# Patient Record
Sex: Female | Born: 1970 | Race: White | Hispanic: No | Marital: Single | Smoking: Current every day smoker
Health system: Southern US, Community
[De-identification: ages and names within clinical notes are randomized; demographics above are authoritative.]

## PROBLEM LIST (undated history)

## (undated) DIAGNOSIS — I10 Essential (primary) hypertension: Secondary | ICD-10-CM

## (undated) DIAGNOSIS — M5430 Sciatica, unspecified side: Secondary | ICD-10-CM

## (undated) DIAGNOSIS — K219 Gastro-esophageal reflux disease without esophagitis: Secondary | ICD-10-CM

## (undated) DIAGNOSIS — F329 Major depressive disorder, single episode, unspecified: Secondary | ICD-10-CM

## (undated) DIAGNOSIS — F419 Anxiety disorder, unspecified: Secondary | ICD-10-CM

## (undated) DIAGNOSIS — F319 Bipolar disorder, unspecified: Secondary | ICD-10-CM

## (undated) DIAGNOSIS — E785 Hyperlipidemia, unspecified: Secondary | ICD-10-CM

## (undated) DIAGNOSIS — E119 Type 2 diabetes mellitus without complications: Secondary | ICD-10-CM

## (undated) DIAGNOSIS — G8929 Other chronic pain: Secondary | ICD-10-CM

## (undated) DIAGNOSIS — E559 Vitamin D deficiency, unspecified: Secondary | ICD-10-CM

## (undated) DIAGNOSIS — F32A Depression, unspecified: Secondary | ICD-10-CM

## (undated) DIAGNOSIS — F4001 Agoraphobia with panic disorder: Secondary | ICD-10-CM

## (undated) HISTORY — PX: ABDOMINAL HYSTERECTOMY: SHX81

## (undated) HISTORY — PX: NECK SURGERY: SHX720

## (undated) HISTORY — PX: BUNIONECTOMY: SHX129

## (undated) HISTORY — PX: BACK SURGERY: SHX140

---

## 1998-11-22 ENCOUNTER — Other Ambulatory Visit: Admission: RE | Admit: 1998-11-22 | Discharge: 1998-11-22 | Payer: Self-pay | Admitting: Podiatrist

## 1999-04-18 ENCOUNTER — Encounter: Payer: Self-pay | Admitting: Emergency Medicine

## 1999-04-18 ENCOUNTER — Emergency Department (HOSPITAL_COMMUNITY): Admission: EM | Admit: 1999-04-18 | Discharge: 1999-04-18 | Payer: Self-pay | Admitting: Emergency Medicine

## 2000-06-20 ENCOUNTER — Emergency Department (HOSPITAL_COMMUNITY): Admission: EM | Admit: 2000-06-20 | Discharge: 2000-06-20 | Payer: Self-pay | Admitting: Emergency Medicine

## 2000-06-20 ENCOUNTER — Encounter: Payer: Self-pay | Admitting: Emergency Medicine

## 2001-06-03 ENCOUNTER — Encounter: Payer: Self-pay | Admitting: Emergency Medicine

## 2001-06-03 ENCOUNTER — Emergency Department (HOSPITAL_COMMUNITY): Admission: EM | Admit: 2001-06-03 | Discharge: 2001-06-03 | Payer: Self-pay | Admitting: Emergency Medicine

## 2001-10-26 ENCOUNTER — Encounter: Payer: Self-pay | Admitting: Emergency Medicine

## 2001-10-26 ENCOUNTER — Emergency Department (HOSPITAL_COMMUNITY): Admission: EM | Admit: 2001-10-26 | Discharge: 2001-10-26 | Payer: Self-pay | Admitting: Emergency Medicine

## 2001-12-02 ENCOUNTER — Emergency Department (HOSPITAL_COMMUNITY): Admission: EM | Admit: 2001-12-02 | Discharge: 2001-12-03 | Payer: Self-pay

## 2003-09-25 ENCOUNTER — Emergency Department (HOSPITAL_COMMUNITY): Admission: EM | Admit: 2003-09-25 | Discharge: 2003-09-25 | Payer: Self-pay | Admitting: Emergency Medicine

## 2004-02-03 ENCOUNTER — Emergency Department (HOSPITAL_COMMUNITY): Admission: EM | Admit: 2004-02-03 | Discharge: 2004-02-03 | Payer: Self-pay | Admitting: Emergency Medicine

## 2004-05-09 ENCOUNTER — Emergency Department (HOSPITAL_COMMUNITY): Admission: EM | Admit: 2004-05-09 | Discharge: 2004-05-09 | Payer: Self-pay | Admitting: Emergency Medicine

## 2004-08-21 ENCOUNTER — Emergency Department (HOSPITAL_COMMUNITY): Admission: EM | Admit: 2004-08-21 | Discharge: 2004-08-21 | Payer: Self-pay | Admitting: Emergency Medicine

## 2004-11-13 ENCOUNTER — Emergency Department (HOSPITAL_COMMUNITY): Admission: EM | Admit: 2004-11-13 | Discharge: 2004-11-13 | Payer: Self-pay | Admitting: Emergency Medicine

## 2005-02-22 IMAGING — CT CT ANGIO CHEST
1 of 4 series · 19 of 36 positions shown · IV contrast (CONTRAST)
Comparison: none

CLINICAL DATA: Chest pain, dyspnea. 
 CT ANGIO CHEST, 02/03/04
 Multidetector CT imaging of the chest was performed according to the protocol for detection of pulmonary embolism during IV bolus injection of 150 ml Omnipaque 300.  Coronal and sagittal plane reformatted images were also generated.

[Series 8133: — · axial · 0.71mm/px · z∈[+1471,+1674]mm · 19 of 373 slices shown]
[im 17/373  lung]
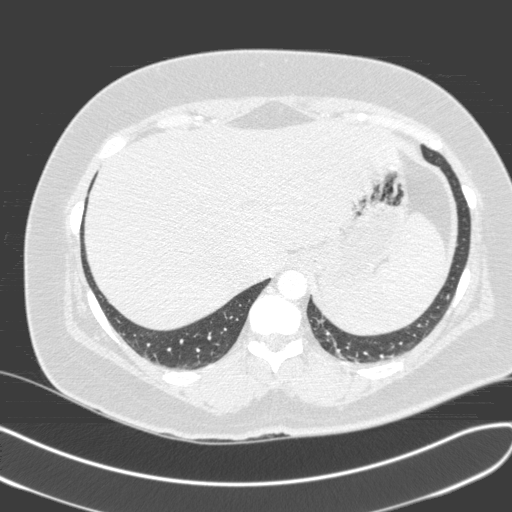
[im 34/373  mediastinal]
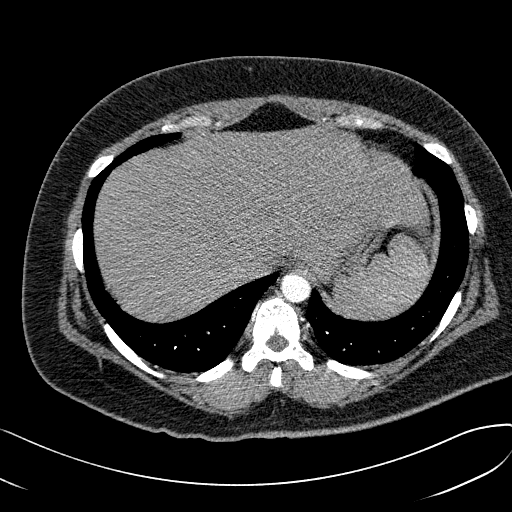
[im 51/373  lung]
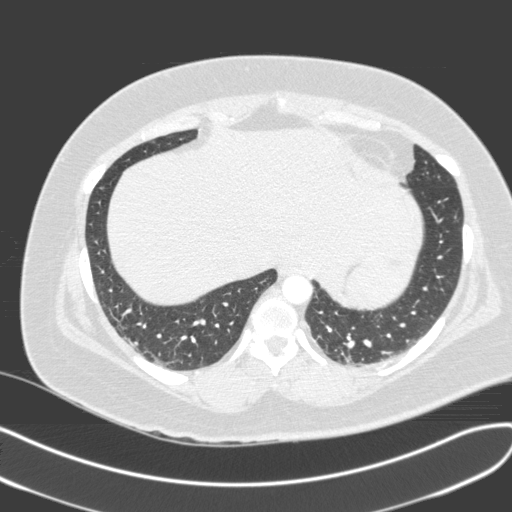
[im 68/373  mediastinal]
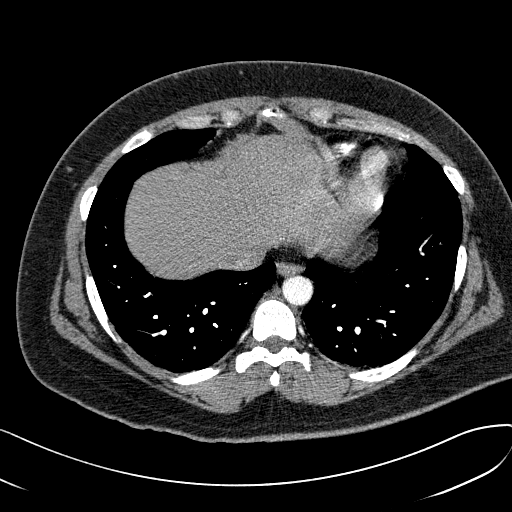
[im 85/373  lung]
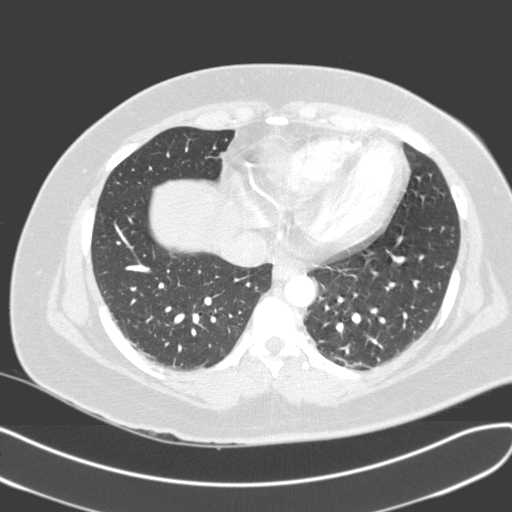
[im 119/373  mediastinal]
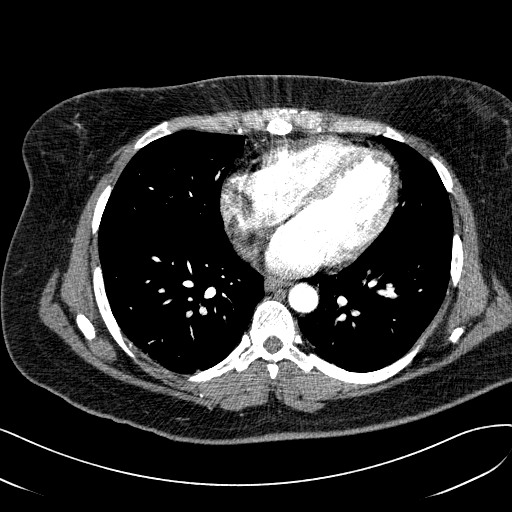
[im 136/373  lung]
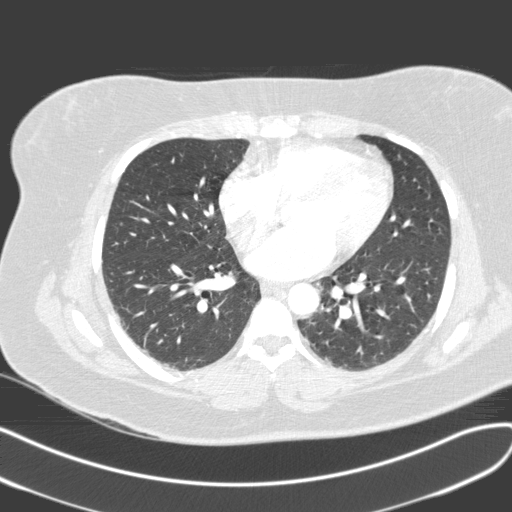
[im 153/373  mediastinal]
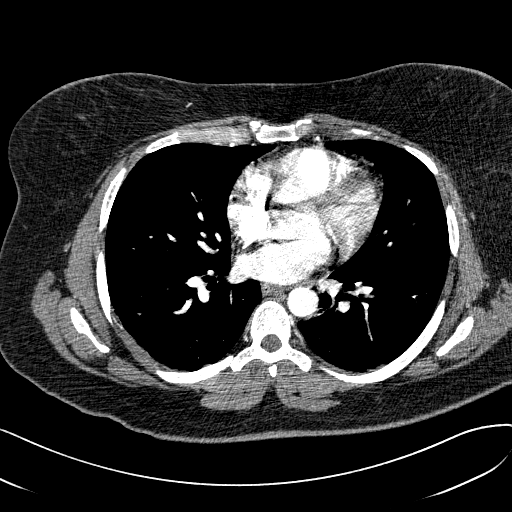
[im 170/373  lung]
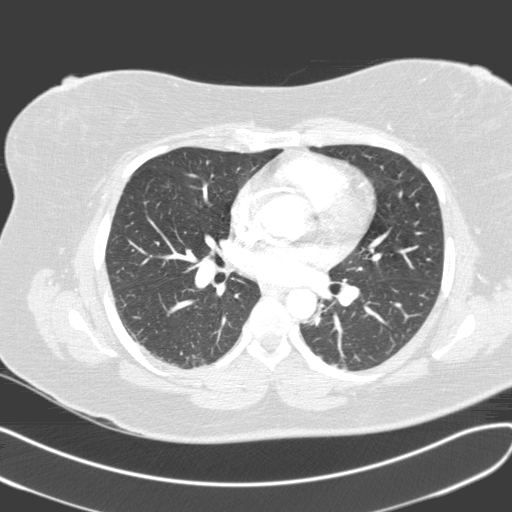
[im 187/373  mediastinal]
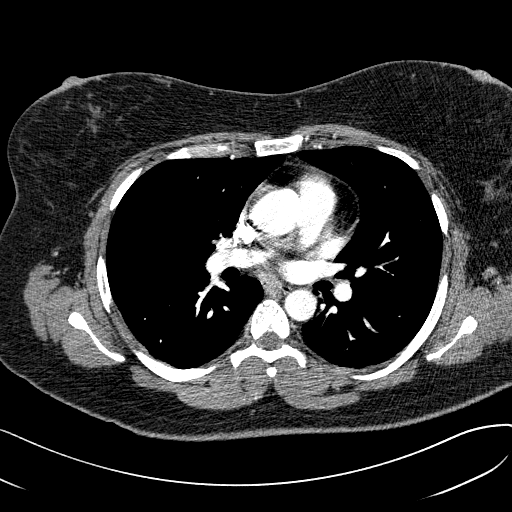
[im 203/373  lung]
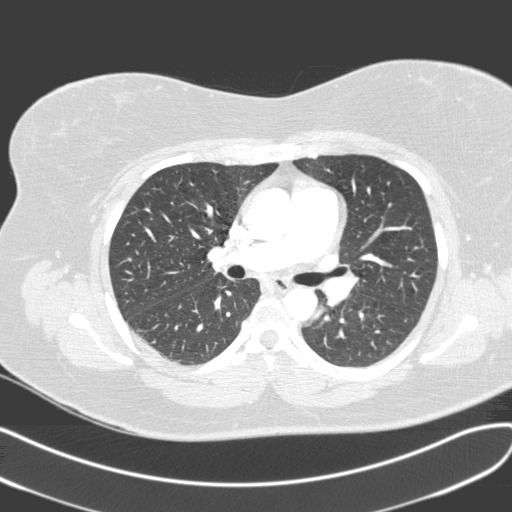
[im 220/373  mediastinal]
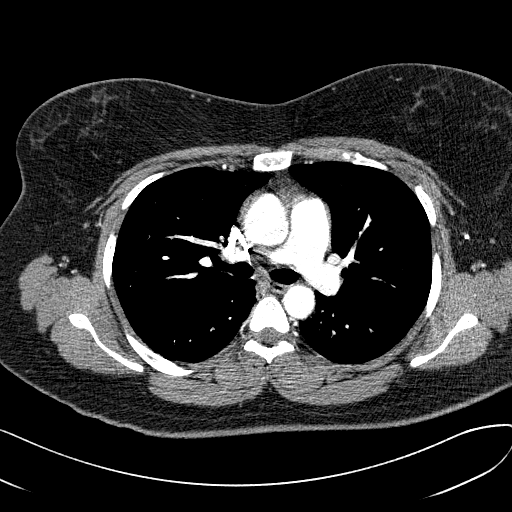
[im 237/373  lung]
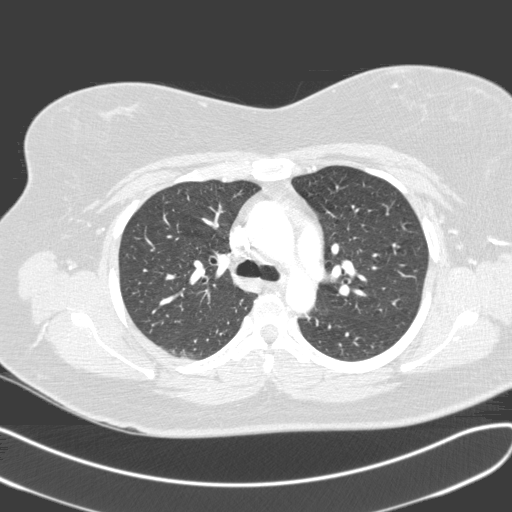
[im 254/373  mediastinal]
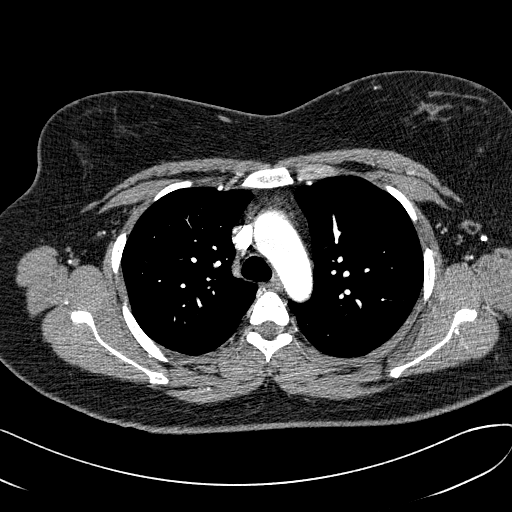
[im 288/373  lung]
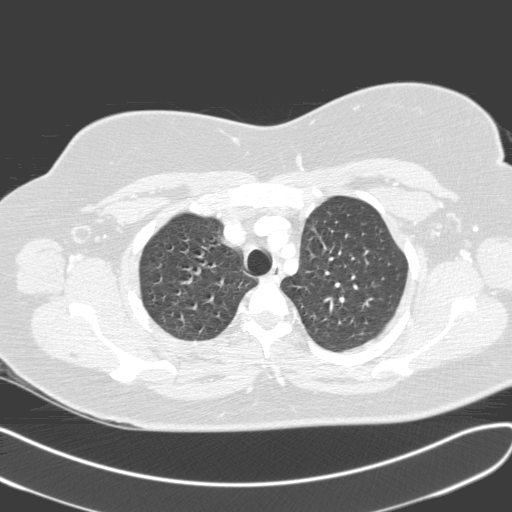
[im 305/373  mediastinal]
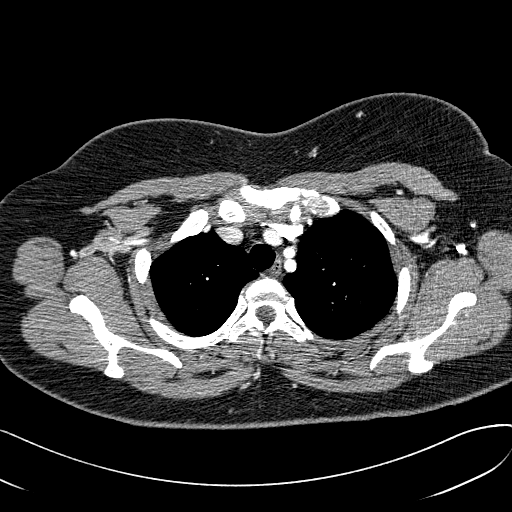
[im 322/373  lung]
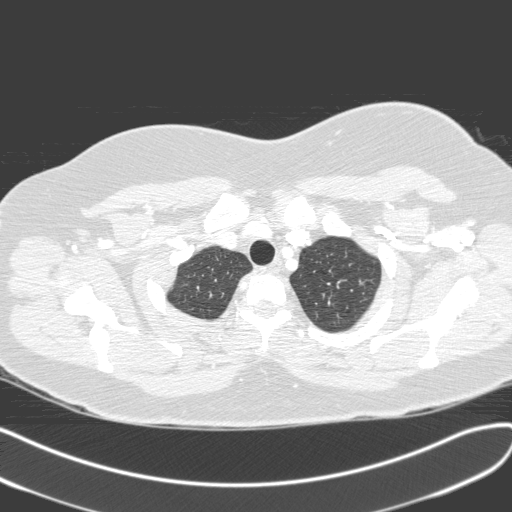
[im 339/373  mediastinal]
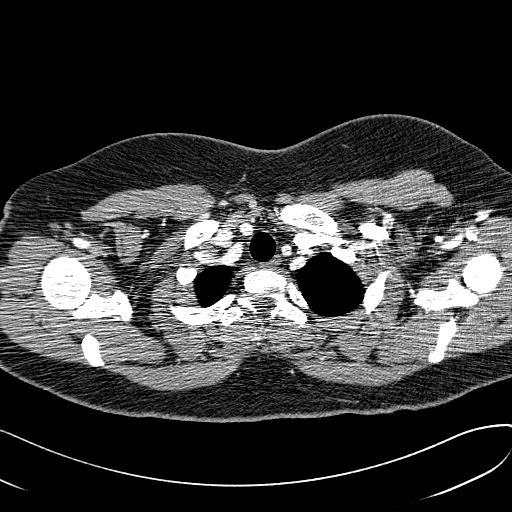
[im 356/373  lung]
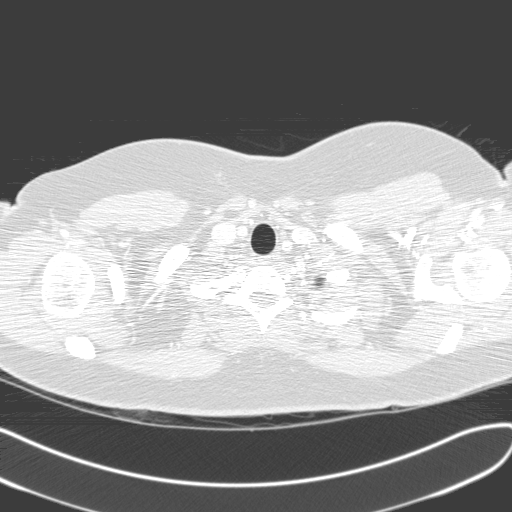

[19 of 36 positions shown; findings below may reference images not displayed]

FINDINGS: Heart and great vessels are unremarkable.  No filling defects in the pulmonary arterial system are noted to suggest pulmonary embolus.  No evidence of aortic aneurysm or dissection.  No pleural or pericardial effusions are present or evidence of enlarged lymph nodes in the axillary, mediastinal or hilar regions.   Lungs are clear except for minimal dependent atelectasis.  Visualized upper abdomen is unremarkable.  
 IMPRESSION
 No evidence of acute abnormality.  
 No evidence of pulmonary embolus.

## 2005-02-22 IMAGING — CR DG CHEST 1V PORT
1 series · 1 of 1 positions shown · non-contrast
Comparison: 09/25/03.

CLINICAL DATA: chest pain   
 PORTABLE CHEST RADIOGRAPH 02/03/04

[view not recorded]
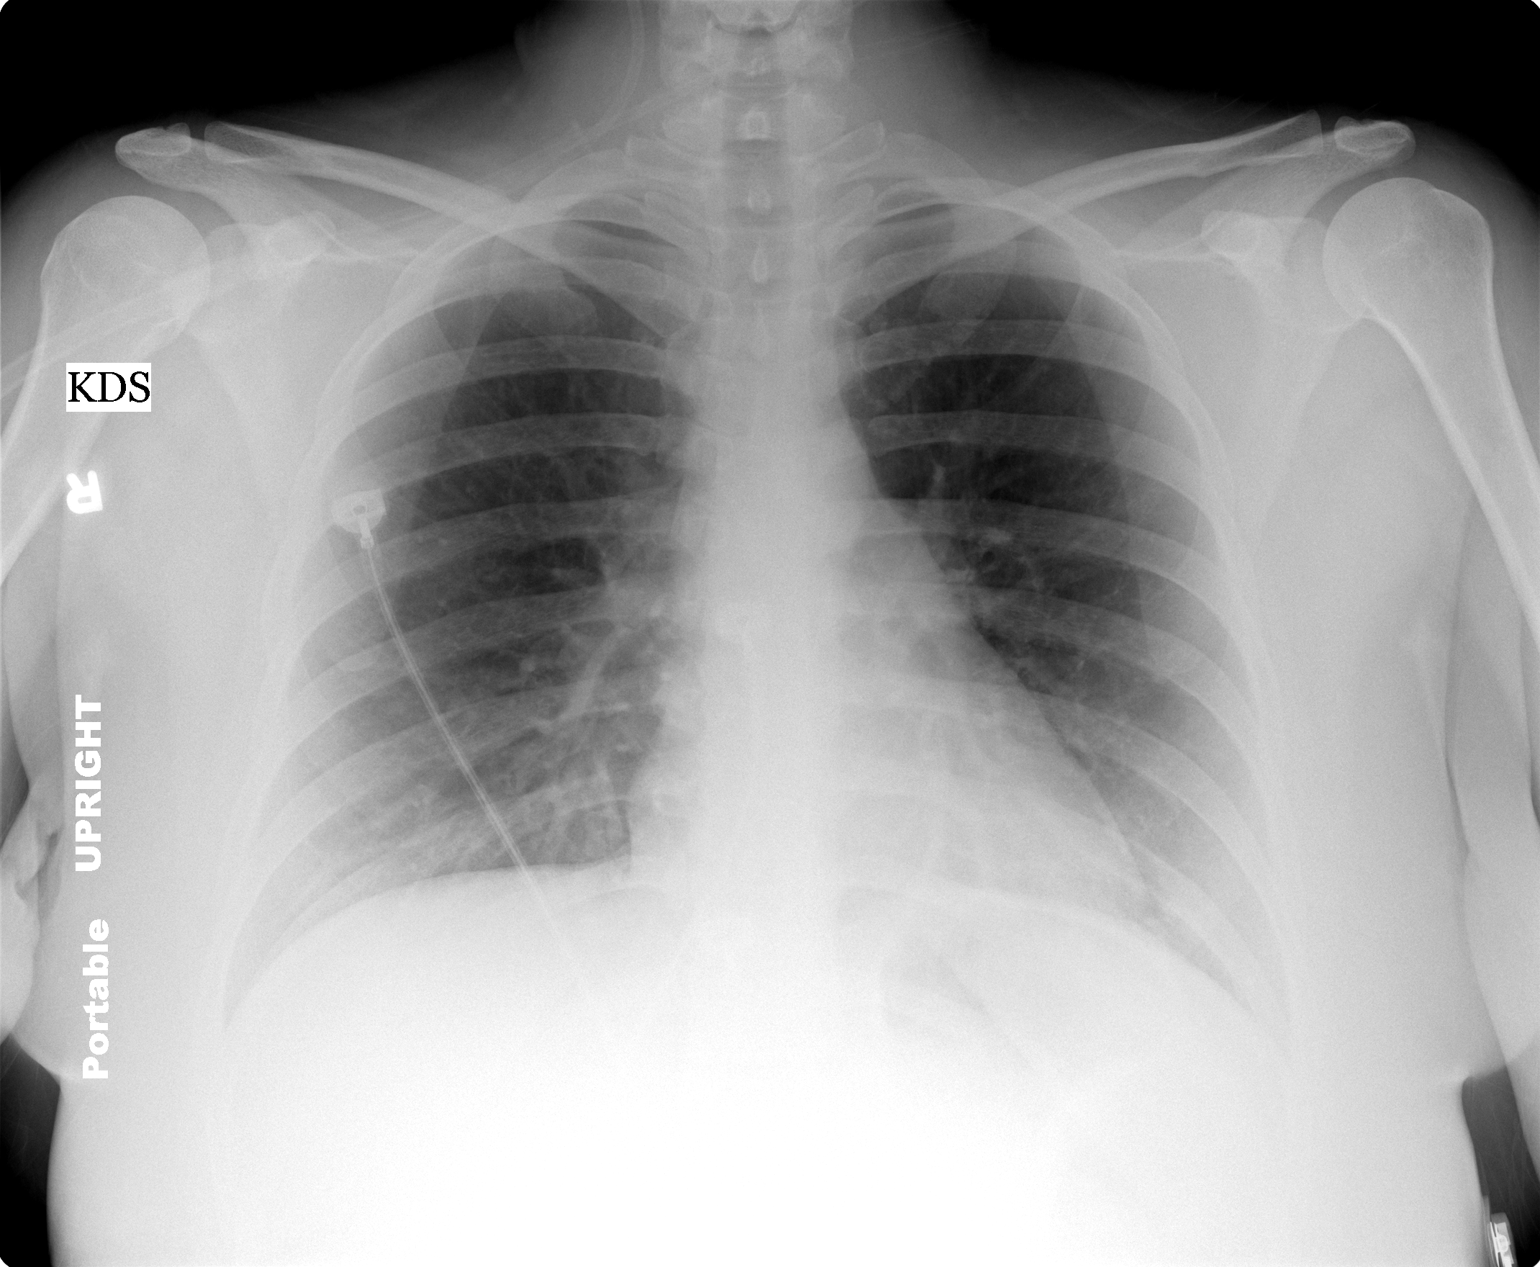

[1 of 1 positions shown; findings below may reference images not displayed]

The heart size and mediastinal contours are within normal limits. The lungs are clear.

 IMPRESSION

 No acute disease.

## 2005-04-15 ENCOUNTER — Emergency Department (HOSPITAL_COMMUNITY): Admission: EM | Admit: 2005-04-15 | Discharge: 2005-04-15 | Payer: Self-pay | Admitting: Emergency Medicine

## 2005-10-09 ENCOUNTER — Emergency Department (HOSPITAL_COMMUNITY): Admission: EM | Admit: 2005-10-09 | Discharge: 2005-10-09 | Payer: Self-pay | Admitting: Emergency Medicine

## 2006-06-13 ENCOUNTER — Ambulatory Visit (HOSPITAL_COMMUNITY): Admission: RE | Admit: 2006-06-13 | Discharge: 2006-06-13 | Payer: Self-pay | Admitting: Family Medicine

## 2007-01-16 ENCOUNTER — Emergency Department (HOSPITAL_COMMUNITY): Admission: EM | Admit: 2007-01-16 | Discharge: 2007-01-16 | Payer: Self-pay | Admitting: Specialist

## 2007-04-13 ENCOUNTER — Emergency Department (HOSPITAL_COMMUNITY): Admission: EM | Admit: 2007-04-13 | Discharge: 2007-04-13 | Payer: Self-pay | Admitting: Emergency Medicine

## 2007-05-04 ENCOUNTER — Emergency Department (HOSPITAL_COMMUNITY): Admission: EM | Admit: 2007-05-04 | Discharge: 2007-05-04 | Payer: Self-pay | Admitting: Emergency Medicine

## 2007-09-18 ENCOUNTER — Emergency Department (HOSPITAL_COMMUNITY): Admission: EM | Admit: 2007-09-18 | Discharge: 2007-09-18 | Payer: Self-pay | Admitting: Emergency Medicine

## 2007-11-02 ENCOUNTER — Emergency Department (HOSPITAL_COMMUNITY): Admission: EM | Admit: 2007-11-02 | Discharge: 2007-11-02 | Payer: Self-pay | Admitting: Emergency Medicine

## 2007-12-27 ENCOUNTER — Emergency Department (HOSPITAL_COMMUNITY): Admission: EM | Admit: 2007-12-27 | Discharge: 2007-12-27 | Payer: Self-pay | Admitting: Emergency Medicine

## 2010-12-23 NOTE — Procedures (Signed)
Kristin Humphrey, Kristin Humphrey                 ACCOUNT NO.:  [id number]   MEDICAL RECORD NO.:  [id number]          PATIENT TYPE:  OUT   LOCATION:  RESP                          FACILITY:  [facility]   PHYSICIAN:  Kofi A. Gerilyn Pilgrim, M.D. DATE OF BIRTH:  09-30-1970   DATE OF PROCEDURE:  DATE OF DISCHARGE:                              EEG INTERPRETATION   Patient is a 40 year old lady who presents with fainting spells.   MEDICATIONS:  Lamictal, Cymbalta, Levaquin.   ANALYSIS:  A 16-channel recording is conducted using a standard 10-20  protocol.  There is a posterior rhythm of 8 Hz that is well formed.  There is beta activity noted in the frontal areas.  Awake and drowsy  activities are recorded.  Photic stimulation does not indicate  significant changes in the background activity.  There is no focal  slowing, lateralized slowing, or epileptiform activity noted.   IMPRESSION:  This is a normal recording of the awake and drowsy states.  The single recording does not rule out the epileptic seizures.  If  clinically indicated, a sleep-deprived recording may be useful.      Kofi A. Gerilyn Pilgrim, M.D.  Electronically Signed     KAD/MEDQ  D:  06/13/2006  T:  06/13/2006  Job:  [id number]

## 2011-04-28 LAB — URINE MICROSCOPIC-ADD ON

## 2011-04-28 LAB — URINALYSIS, ROUTINE W REFLEX MICROSCOPIC
Bilirubin Urine: NEGATIVE
Glucose, UA: NEGATIVE
Ketones, ur: NEGATIVE
Nitrite: NEGATIVE
Protein, ur: NEGATIVE
Specific Gravity, Urine: <1.005 — ABNORMAL LOW
Urobilinogen, UA: 0.2
pH: 6.5

## 2011-05-01 LAB — URINALYSIS, ROUTINE W REFLEX MICROSCOPIC
Bilirubin Urine: NEGATIVE
Glucose, UA: 100 — AB
Ketones, ur: NEGATIVE
Nitrite: NEGATIVE
Protein, ur: NEGATIVE
Specific Gravity, Urine: 1.025
Urobilinogen, UA: 0.2
pH: 5.5

## 2011-05-01 LAB — PREGNANCY, URINE: Preg Test, Ur: NEGATIVE

## 2011-05-01 LAB — URINE MICROSCOPIC-ADD ON

## 2011-05-03 LAB — URINE MICROSCOPIC-ADD ON

## 2011-05-03 LAB — DIFFERENTIAL
Basophils Absolute: 0.1
Eosinophils Relative: 2
Lymphocytes Relative: 29
Neutro Abs: 10 — ABNORMAL HIGH
Neutrophils Relative %: 65

## 2011-05-03 LAB — URINALYSIS, ROUTINE W REFLEX MICROSCOPIC
Glucose, UA: NEGATIVE
Ketones, ur: NEGATIVE
Leukocytes, UA: NEGATIVE
Nitrite: NEGATIVE
Protein, ur: 30 — AB
Specific Gravity, Urine: >1.03 — ABNORMAL HIGH
Urobilinogen, UA: 0.2
pH: 5.5

## 2011-05-03 LAB — BASIC METABOLIC PANEL
BUN: 8
CO2: 26
Calcium: 9.4
Chloride: 107
Creatinine, Ser: 0.78
GFR calc Af Amer: >60
GFR calc non Af Amer: >60
Glucose, Bld: 194 — ABNORMAL HIGH
Potassium: 3.8
Sodium: 140

## 2011-05-03 LAB — CBC
HCT: 38.5
Hemoglobin: 13.4
MCHC: 34.7
MCV: 86
Platelets: 314
RBC: 4.47
RDW: 14.7
WBC: 15.6 — ABNORMAL HIGH

## 2011-05-19 LAB — URINE CULTURE: Colony Count: 35000

## 2011-05-19 LAB — URINALYSIS, ROUTINE W REFLEX MICROSCOPIC
Ketones, ur: NEGATIVE
Leukocytes, UA: NEGATIVE
Nitrite: NEGATIVE
Specific Gravity, Urine: 1.02
pH: 6.5

## 2011-05-19 LAB — URINE MICROSCOPIC-ADD ON

## 2014-01-08 ENCOUNTER — Telehealth: Payer: Self-pay | Admitting: Internal Medicine

## 2014-01-08 NOTE — Telephone Encounter (Signed)
This pt has never been seen in out office I called Iris from [facility] and she reports she did not call us regarding this pt and did not know who I was talking about Will take message to [facility] and sign off

## 2015-04-03 ENCOUNTER — Emergency Department (HOSPITAL_COMMUNITY)
Admission: EM | Admit: 2015-04-03 | Discharge: 2015-04-03 | Disposition: A | Discharge status: Other Acute Inpatient Hospital | Payer: PRIVATE HEALTH INSURANCE | Attending: Emergency Medicine | Admitting: Emergency Medicine

## 2015-04-03 ENCOUNTER — Encounter (HOSPITAL_COMMUNITY): Payer: Self-pay | Admitting: Emergency Medicine

## 2015-04-03 ENCOUNTER — Inpatient Hospital Stay (HOSPITAL_COMMUNITY)
Admission: AD | Admit: 2015-04-03 | Discharge: 2015-04-07 | DRG: 885 | Disposition: A | Discharge status: Home / Self Care | Payer: PRIVATE HEALTH INSURANCE | Source: Intra-hospital | Attending: Psychiatry | Admitting: Psychiatry

## 2015-04-03 DIAGNOSIS — Z008 Encounter for other general examination: Secondary | ICD-10-CM | POA: Diagnosis present

## 2015-04-03 DIAGNOSIS — F332 Major depressive disorder, recurrent severe without psychotic features: Principal | ICD-10-CM | POA: Diagnosis present

## 2015-04-03 DIAGNOSIS — Z833 Family history of diabetes mellitus: Secondary | ICD-10-CM | POA: Diagnosis not present

## 2015-04-03 DIAGNOSIS — G4700 Insomnia, unspecified: Secondary | ICD-10-CM | POA: Diagnosis present

## 2015-04-03 DIAGNOSIS — Z8249 Family history of ischemic heart disease and other diseases of the circulatory system: Secondary | ICD-10-CM

## 2015-04-03 DIAGNOSIS — Z3202 Encounter for pregnancy test, result negative: Secondary | ICD-10-CM | POA: Insufficient documentation

## 2015-04-03 DIAGNOSIS — F419 Anxiety disorder, unspecified: Secondary | ICD-10-CM | POA: Diagnosis not present

## 2015-04-03 DIAGNOSIS — F17210 Nicotine dependence, cigarettes, uncomplicated: Secondary | ICD-10-CM | POA: Diagnosis present

## 2015-04-03 DIAGNOSIS — F410 Panic disorder [episodic paroxysmal anxiety] without agoraphobia: Secondary | ICD-10-CM | POA: Diagnosis present

## 2015-04-03 DIAGNOSIS — E559 Vitamin D deficiency, unspecified: Secondary | ICD-10-CM | POA: Diagnosis present

## 2015-04-03 DIAGNOSIS — Z720 Tobacco use: Secondary | ICD-10-CM | POA: Diagnosis not present

## 2015-04-03 DIAGNOSIS — F329 Major depressive disorder, single episode, unspecified: Secondary | ICD-10-CM | POA: Diagnosis present

## 2015-04-03 DIAGNOSIS — E119 Type 2 diabetes mellitus without complications: Secondary | ICD-10-CM | POA: Insufficient documentation

## 2015-04-03 DIAGNOSIS — R000 Tachycardia, unspecified: Secondary | ICD-10-CM

## 2015-04-03 DIAGNOSIS — M549 Dorsalgia, unspecified: Secondary | ICD-10-CM | POA: Diagnosis not present

## 2015-04-03 DIAGNOSIS — F411 Generalized anxiety disorder: Secondary | ICD-10-CM | POA: Diagnosis present

## 2015-04-03 DIAGNOSIS — R45851 Suicidal ideations: Secondary | ICD-10-CM

## 2015-04-03 DIAGNOSIS — F322 Major depressive disorder, single episode, severe without psychotic features: Secondary | ICD-10-CM | POA: Insufficient documentation

## 2015-04-03 HISTORY — DX: Anxiety disorder, unspecified: F41.9

## 2015-04-03 HISTORY — DX: Depression, unspecified: F32.A

## 2015-04-03 HISTORY — DX: Type 2 diabetes mellitus without complications: E11.9

## 2015-04-03 HISTORY — DX: Major depressive disorder, single episode, unspecified: F32.9

## 2015-04-03 LAB — COMPREHENSIVE METABOLIC PANEL
ALK PHOS: 74 U/L (ref 38–126)
ALT: 18 U/L (ref 14–54)
AST: 23 U/L (ref 15–41)
Albumin: 4.1 g/dL (ref 3.5–5.0)
Anion gap: 14 (ref 5–15)
BUN: 13 mg/dL (ref 6–20)
CALCIUM: 9.3 mg/dL (ref 8.9–10.3)
CO2: 21 mmol/L — ABNORMAL LOW (ref 22–32)
CREATININE: 0.69 mg/dL (ref 0.44–1.00)
Chloride: 102 mmol/L (ref 101–111)
Glucose, Bld: 275 mg/dL — ABNORMAL HIGH (ref 65–99)
Potassium: 3.6 mmol/L (ref 3.5–5.1)
Sodium: 137 mmol/L (ref 135–145)
Total Bilirubin: 0.5 mg/dL (ref 0.3–1.2)
Total Protein: 7.6 g/dL (ref 6.5–8.1)

## 2015-04-03 LAB — CBC WITH DIFFERENTIAL/PLATELET
BASOS ABS: 0.1 10*3/uL (ref 0.0–0.1)
BASOS PCT: 1 % (ref 0–1)
EOS ABS: 0.3 10*3/uL (ref 0.0–0.7)
Eosinophils Relative: 2 % (ref 0–5)
HCT: 36.6 % (ref 36.0–46.0)
HEMOGLOBIN: 11.2 g/dL — AB (ref 12.0–15.0)
Lymphocytes Relative: 31 % (ref 12–46)
Lymphs Abs: 4.4 10*3/uL — ABNORMAL HIGH (ref 0.7–4.0)
MCH: 22.2 pg — ABNORMAL LOW (ref 26.0–34.0)
MCHC: 30.6 g/dL (ref 30.0–36.0)
MCV: 72.6 fL — ABNORMAL LOW (ref 78.0–100.0)
Monocytes Absolute: 0.7 10*3/uL (ref 0.1–1.0)
Monocytes Relative: 5 % (ref 3–12)
NEUTROS PCT: 61 % (ref 43–77)
Neutro Abs: 8.6 10*3/uL — ABNORMAL HIGH (ref 1.7–7.7)
Platelets: 308 10*3/uL (ref 150–400)
RBC: 5.04 MIL/uL (ref 3.87–5.11)
RDW: 16.9 % — ABNORMAL HIGH (ref 11.5–15.5)
WBC: 14.2 10*3/uL — AB (ref 4.0–10.5)

## 2015-04-03 LAB — RAPID URINE DRUG SCREEN, HOSP PERFORMED
AMPHETAMINES: NOT DETECTED
BENZODIAZEPINES: NOT DETECTED
Barbiturates: NOT DETECTED
Cocaine: NOT DETECTED
OPIATES: NOT DETECTED
Tetrahydrocannabinol: NOT DETECTED

## 2015-04-03 LAB — ETHANOL: Alcohol, Ethyl (B): <5 mg/dL (ref <5)

## 2015-04-03 LAB — POC URINE PREG, ED: PREG TEST UR: NEGATIVE

## 2015-04-03 LAB — CBG MONITORING, ED
GLUCOSE-CAPILLARY: 254 mg/dL — AB (ref 65–99)
GLUCOSE-CAPILLARY: 224 mg/dL — AB (ref 65–99)
GLUCOSE-CAPILLARY: 171 mg/dL — AB (ref 65–99)

## 2015-04-03 MED ORDER — LORAZEPAM 1 MG PO TABS
1.0000 mg | ORAL_TABLET | Freq: Once | ORAL | Status: AC
  Administered 2015-04-03: 1 mg via ORAL
  Filled 2015-04-03: qty 1

## 2015-04-03 MED ORDER — NICOTINE 14 MG/24HR TD PT24
14.0000 mg | MEDICATED_PATCH | Freq: Every day | TRANSDERMAL | Status: DC
  Administered 2015-04-03: 14 mg via TRANSDERMAL
  Filled 2015-04-03: qty 1

## 2015-04-03 MED ORDER — GABAPENTIN 400 MG PO CAPS
ORAL_CAPSULE | ORAL | Status: AC
Start: 2015-04-03 — End: 2015-04-03
  Filled 2015-04-03: qty 2

## 2015-04-03 MED ORDER — ALUM & MAG HYDROXIDE-SIMETH 200-200-20 MG/5ML PO SUSP
30.0000 mL | ORAL | Status: DC | PRN
Start: 2015-04-03 — End: 2015-04-03

## 2015-04-03 MED ORDER — BACLOFEN 10 MG PO TABS
10.0000 mg | ORAL_TABLET | Freq: Two times a day (BID) | ORAL | Status: DC
  Administered 2015-04-03: 10 mg via ORAL
  Filled 2015-04-03: qty 1

## 2015-04-03 MED ORDER — GABAPENTIN 300 MG PO CAPS
ORAL_CAPSULE | ORAL | Status: AC
  Filled 2015-04-03: qty 2

## 2015-04-03 MED ORDER — GABAPENTIN 300 MG PO CAPS
ORAL_CAPSULE | ORAL | Status: AC
  Filled 2015-04-03: qty 1

## 2015-04-03 MED ORDER — LISINOPRIL 10 MG PO TABS: 20.0000 mg | ORAL_TABLET | Freq: Every day | ORAL | Status: DC

## 2015-04-03 MED ORDER — ACETAMINOPHEN 500 MG PO TABS: 1000.0000 mg | ORAL_TABLET | Freq: Four times a day (QID) | ORAL | Status: DC | PRN

## 2015-04-03 MED ORDER — CLONAZEPAM 0.5 MG PO TABS
0.5000 mg | ORAL_TABLET | Freq: Three times a day (TID) | ORAL | Status: DC
  Administered 2015-04-03: 0.5 mg via ORAL
  Filled 2015-04-03: qty 1

## 2015-04-03 MED ORDER — LEVOTHYROXINE SODIUM 50 MCG PO TABS: 100.0000 ug | ORAL_TABLET | Freq: Every day | ORAL | Status: DC

## 2015-04-03 MED ORDER — IBUPROFEN 800 MG PO TABS
800.0000 mg | ORAL_TABLET | Freq: Four times a day (QID) | ORAL | Status: DC | PRN
  Administered 2015-04-03: 800 mg via ORAL
  Filled 2015-04-03: qty 1

## 2015-04-03 MED ORDER — INSULIN ASPART PROT & ASPART (70-30 MIX) 100 UNIT/ML ~~LOC~~ SUSP
SUBCUTANEOUS | Status: AC
  Filled 2015-04-03: qty 10

## 2015-04-03 MED ORDER — GABAPENTIN 100 MG PO CAPS
ORAL_CAPSULE | ORAL | Status: AC
  Filled 2015-04-03: qty 2

## 2015-04-03 MED ORDER — INSULIN GLARGINE 100 UNIT/ML ~~LOC~~ SOLN
10.0000 [IU] | Freq: Every day | SUBCUTANEOUS | Status: DC
  Administered 2015-04-03: 10 [IU] via SUBCUTANEOUS
  Filled 2015-04-03 (×2): qty 0.1

## 2015-04-03 MED ORDER — MAGNESIUM HYDROXIDE 400 MG/5ML PO SUSP: 30.0000 mL | Freq: Every day | ORAL | Status: DC | PRN

## 2015-04-03 MED ORDER — INSULIN ASPART PROT & ASPART (70-30 MIX) 100 UNIT/ML ~~LOC~~ SUSP
24.0000 [IU] | Freq: Every day | SUBCUTANEOUS | Status: DC
  Administered 2015-04-03: 24 [IU] via SUBCUTANEOUS
  Filled 2015-04-03: qty 10

## 2015-04-03 MED ORDER — LISINOPRIL-HYDROCHLOROTHIAZIDE 20-25 MG PO TABS: 1.0000 | ORAL_TABLET | Freq: Every day | ORAL | Status: DC

## 2015-04-03 MED ORDER — HYDROCHLOROTHIAZIDE 25 MG PO TABS: 25.0000 mg | ORAL_TABLET | Freq: Every day | ORAL | Status: DC

## 2015-04-03 MED ORDER — GABAPENTIN 800 MG PO TABS
800.0000 mg | ORAL_TABLET | Freq: Four times a day (QID) | ORAL | Status: DC
  Administered 2015-04-03 (×2): 800 mg via ORAL
  Filled 2015-04-03 (×8): qty 1

## 2015-04-03 MED ORDER — ACETAMINOPHEN 325 MG PO TABS: 650.0000 mg | ORAL_TABLET | ORAL | Status: DC | PRN

## 2015-04-03 MED ORDER — LORAZEPAM 1 MG PO TABS: 1.0000 mg | ORAL_TABLET | ORAL | Status: DC | PRN

## 2015-04-03 MED ORDER — PREDNISONE 20 MG PO TABS
40.0000 mg | ORAL_TABLET | Freq: Once | ORAL | Status: AC
  Administered 2015-04-03: 40 mg via ORAL
  Filled 2015-04-03: qty 2

## 2015-04-03 NOTE — ED Notes (Signed)
Spoke with Bernette Redbird from [facility]-states someone will be here within the next 30 minutes to pick patient up.

## 2015-04-03 NOTE — ED Notes (Signed)
Pt reports depression and anxiety. Father died last of 11/10/22. Pt reports "feeling she would be better off dead."
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
## 2015-04-03 NOTE — BH Assessment (Signed)
[facility] Assessment Progress Note Pt was accepted to [facility] by Julieanne Cotton. Aggie, NP recommended IP treatment, and pt will be treated by Dr. Jama Flavors. She can transport by Juel Burrow and will be going to bed 401-2. Call report number is [phone].

## 2015-04-03 NOTE — ED Notes (Signed)
Per [facility] patient has been at [facility] room 401-2.

## 2015-04-03 NOTE — ED Notes (Signed)
Report called to [facility] to [place].

## 2015-04-03 NOTE — ED Provider Notes (Signed)
CSN: [id number]     Arrival date & time 04/03/15  1359 History   First MD Initiated Contact with Patient 04/03/15 1413     Chief Complaint  Patient presents with  . V70.1    Patient is a 44 y.o. female presenting with mental health disorder. The history is provided by the patient.  Mental Health Problem Presenting symptoms: depression and suicidal thoughts   Degree of incapacity (severity):  Severe Onset quality:  Gradual Timing:  Constant Progression:  Worsening Chronicity:  New Relieved by:  Nothing Worsened by:  Nothing tried Associated symptoms: anxiety   Associated symptoms: no abdominal pain, no chest pain and no headaches   Pt presents for mental health evaluation Pt reports she lost her father several months ago and since then she has had frequent panic attacks and crying spells.  She also has thoughts of harming herself but has not attempted as of yet   Past Medical History  Diagnosis Date  . Depression   . Anxiety   . Diabetes mellitus without complication    Past Surgical History  Procedure Laterality Date  . Neck surgery    . Bunionectomy     Family History  Problem Relation Age of Onset  . Heart failure Mother   . Diabetes Mother   . Cancer Father   . Cancer Other   . Diabetes Other    Social History  Substance Use Topics  . Smoking status: Current Every Day Smoker -- 0.50 packs/day    Types: Cigarettes  . Smokeless tobacco: Never Used  . Alcohol Use: No   OB History    No data available     Review of Systems  Constitutional: Negative for fever.  Respiratory: Negative for shortness of breath.   Cardiovascular: Negative for chest pain.  Gastrointestinal: Negative for abdominal pain.  Musculoskeletal: Positive for back pain.  Neurological: Negative for headaches.  Psychiatric/Behavioral: Positive for suicidal ideas. The patient is nervous/anxious.   All other systems reviewed and are negative.     Allergies  Celebrex and Zomig  Home  Medications   Prior to Admission medications   Not on File   BP 121/93 mmHg  Pulse 130  Temp(Src) 97.7 F (36.5 C) (Oral)  Resp 20  Ht 5\' 4"  (1.626 m)  Wt 191 lb (86.637 kg)  BMI 32.77 kg/m2  SpO2 98%  LMP 04/03/2015 Physical Exam CONSTITUTIONAL: tearful anxious HEAD: Normocephalic/atraumatic EYES: EOMI ENMT: Mucous membranes moist NECK: supple no meningeal signs CV: S1/S2 noted, tachycardic LUNGS: Lungs are clear to auscultation bilaterally, no apparent distress ABDOMEN: soft, nontender, no rebound or guarding, bowel sounds noted throughout abdomen NEURO: Pt is awake/alert/appropriate, moves all extremitiesx4.  No facial droop.   EXTREMITIES: pulses normal/equal, full ROM SKIN: warm, color normal PSYCH: anxious, tearful  ED Course  Procedures  4:04 PM Pt with panic attacks and also endorsing SI She was tachycardic but likely due to anxiety HR is improving Pt is medically stable and awaiting psych evaluation BP 124/74 mmHg  Pulse 128  Temp(Src) 97.7 F (36.5 C) (Oral)  Resp 18  Ht 5\' 4"  (1.626 m)  Wt 191 lb (86.637 kg)  BMI 32.77 kg/m2  SpO2 98%  LMP 04/03/2015  Labs Review Labs Reviewed  COMPREHENSIVE METABOLIC PANEL - Abnormal; Notable for the following:    CO2 21 (*)    Glucose, Bld 275 (*)    All other components within normal limits  CBC WITH DIFFERENTIAL/PLATELET - Abnormal; Notable for the following:  WBC 14.2 (*)    Hemoglobin 11.2 (*)    MCV 72.6 (*)    MCH 22.2 (*)    RDW 16.9 (*)    Neutro Abs 8.6 (*)    Lymphs Abs 4.4 (*)    All other components within normal limits  URINE RAPID DRUG SCREEN, HOSP PERFORMED  ETHANOL  POC URINE PREG, ED   I have personally reviewed and evaluated these lab results as part of my medical decision-making.   EKG Interpretation   Date/Time:  Saturday April 03 2015 14:22:29 EDT Ventricular Rate:  134 PR Interval:  76 QRS Duration: 84 QT Interval:  410 QTC Calculation: 612 R Axis:   83 Text  Interpretation:  Sinus tachycardia Prolonged QT interval Abnormal ekg  No previous ECGs available Confirmed by Bebe Shaggy  MD, Dorinda Hill ([id number]) on  04/03/2015 2:33:30 PM     Medications  LORazepam (ATIVAN) tablet 1 mg (not administered)  acetaminophen (TYLENOL) tablet 650 mg (not administered)  nicotine (NICODERM CQ - dosed in mg/24 hours) patch 14 mg (14 mg Transdermal Patch Applied 04/03/15 1537)  LORazepam (ATIVAN) tablet 1 mg (1 mg Oral Given 04/03/15 1430)  LORazepam (ATIVAN) tablet 1 mg (1 mg Oral Given 04/03/15 1500)    MDM   Final diagnoses:  Sinus tachycardia  Anxiety  Suicidal ideation    Nursing notes including past medical history and social history reviewed and considered in documentation Labs/vital reviewed myself and considered during evaluation     Zadie Rhine, MD 04/03/15 1607

## 2015-04-03 NOTE — ED Notes (Signed)
Pt reports hx of depression that is made worse by her chronic back pain. Pt report shaving panic attack and suicidal ideation due to pain. Pt reports similar instances in the past that she was hospitalized for. Pt states she feels like her symptoms are getting worse so she came in to "catch it early"

## 2015-04-03 NOTE — BH Assessment (Signed)
[facility] Assessment Progress Note Spoke with Dr. Bebe Shaggy, took history of pt, and ED staff will put machine in the room shortly.  Assessment to begin at that time.

## 2015-04-03 NOTE — ED Notes (Signed)
Bernette Redbird here for transport to [facility]. Belongings given to [place].

## 2015-04-03 NOTE — ED Notes (Signed)
Contacted [facility] to obtain medication for pt not stored in ER pyxis.

## 2015-04-04 ENCOUNTER — Encounter (HOSPITAL_COMMUNITY): Payer: Self-pay | Admitting: Emergency Medicine

## 2015-04-04 DIAGNOSIS — F332 Major depressive disorder, recurrent severe without psychotic features: Principal | ICD-10-CM

## 2015-04-04 DIAGNOSIS — F411 Generalized anxiety disorder: Secondary | ICD-10-CM

## 2015-04-04 LAB — GLUCOSE, CAPILLARY
GLUCOSE-CAPILLARY: 236 mg/dL — AB (ref 65–99)
Glucose-Capillary: 266 mg/dL — ABNORMAL HIGH (ref 65–99)
Glucose-Capillary: 246 mg/dL — ABNORMAL HIGH (ref 65–99)

## 2015-04-04 MED ORDER — LEVOTHYROXINE SODIUM 100 MCG PO TABS
100.0000 ug | ORAL_TABLET | Freq: Every day | ORAL | Status: DC
  Administered 2015-04-04 – 2015-04-07 (×4): 100 ug via ORAL
  Filled 2015-04-04 (×5): qty 1

## 2015-04-04 MED ORDER — TRAZODONE HCL 50 MG PO TABS
50.0000 mg | ORAL_TABLET | Freq: Every day | ORAL | Status: DC
  Administered 2015-04-04 – 2015-04-06 (×2): 50 mg via ORAL
  Filled 2015-04-04 (×5): qty 1

## 2015-04-04 MED ORDER — IBUPROFEN 800 MG PO TABS: 800.0000 mg | ORAL_TABLET | Freq: Four times a day (QID) | ORAL | Status: DC | PRN

## 2015-04-04 MED ORDER — NICOTINE POLACRILEX 2 MG MT GUM
CHEWING_GUM | OROMUCOSAL | Status: AC
  Administered 2015-04-04: 2 mg
  Filled 2015-04-04: qty 1

## 2015-04-04 MED ORDER — DIVALPROEX SODIUM 500 MG PO DR TAB
500.0000 mg | DELAYED_RELEASE_TABLET | Freq: Two times a day (BID) | ORAL | Status: DC
  Administered 2015-04-04 – 2015-04-07 (×6): 500 mg via ORAL
  Filled 2015-04-04 (×8): qty 1

## 2015-04-04 MED ORDER — DULOXETINE HCL 30 MG PO CPEP
30.0000 mg | ORAL_CAPSULE | Freq: Every day | ORAL | Status: DC
  Administered 2015-04-04 – 2015-04-05 (×2): 30 mg via ORAL
  Filled 2015-04-04 (×4): qty 1

## 2015-04-04 MED ORDER — NICOTINE POLACRILEX 2 MG MT GUM
CHEWING_GUM | OROMUCOSAL | Status: AC
  Filled 2015-04-04: qty 1

## 2015-04-04 MED ORDER — GABAPENTIN 400 MG PO CAPS
800.0000 mg | ORAL_CAPSULE | Freq: Four times a day (QID) | ORAL | Status: DC
  Administered 2015-04-04 – 2015-04-07 (×14): 800 mg via ORAL
  Filled 2015-04-04 (×17): qty 2

## 2015-04-04 MED ORDER — INSULIN ASPART 100 UNIT/ML ~~LOC~~ SOLN
0.0000 [IU] | Freq: Three times a day (TID) | SUBCUTANEOUS | Status: DC
  Administered 2015-04-04 – 2015-04-05 (×4): 5 [IU] via SUBCUTANEOUS
  Administered 2015-04-06: 3 [IU] via SUBCUTANEOUS
  Administered 2015-04-06: 2 [IU] via SUBCUTANEOUS
  Administered 2015-04-06: 5 [IU] via SUBCUTANEOUS
  Administered 2015-04-07: 2 [IU] via SUBCUTANEOUS
  Administered 2015-04-07: 5 [IU] via SUBCUTANEOUS

## 2015-04-04 MED ORDER — INSULIN GLARGINE 100 UNIT/ML ~~LOC~~ SOLN
10.0000 [IU] | Freq: Every day | SUBCUTANEOUS | Status: DC
  Administered 2015-04-04 – 2015-04-06 (×3): 10 [IU] via SUBCUTANEOUS

## 2015-04-04 MED ORDER — NICOTINE POLACRILEX 2 MG MT GUM
2.0000 mg | CHEWING_GUM | OROMUCOSAL | Status: DC | PRN
  Administered 2015-04-04 – 2015-04-07 (×10): 2 mg via ORAL
  Filled 2015-04-04 (×7): qty 1

## 2015-04-04 MED ORDER — TRAMADOL HCL 50 MG PO TABS
50.0000 mg | ORAL_TABLET | Freq: Four times a day (QID) | ORAL | Status: DC | PRN
  Administered 2015-04-04 – 2015-04-07 (×9): 50 mg via ORAL
  Filled 2015-04-04 (×9): qty 1

## 2015-04-04 MED ORDER — BACLOFEN 10 MG PO TABS
10.0000 mg | ORAL_TABLET | Freq: Two times a day (BID) | ORAL | Status: DC
Start: 2015-04-04 — End: 2015-04-07
  Administered 2015-04-04 – 2015-04-07 (×7): 10 mg via ORAL
  Filled 2015-04-04 (×9): qty 1

## 2015-04-04 MED ORDER — INSULIN ASPART 100 UNIT/ML ~~LOC~~ SOLN: 4.0000 [IU] | Freq: Three times a day (TID) | SUBCUTANEOUS | Status: DC

## 2015-04-04 MED ORDER — ALUM & MAG HYDROXIDE-SIMETH 200-200-20 MG/5ML PO SUSP: 30.0000 mL | ORAL | Status: DC | PRN

## 2015-04-04 MED ORDER — LIDOCAINE 5 % EX PTCH
1.0000 | MEDICATED_PATCH | CUTANEOUS | Status: DC
  Administered 2015-04-05 – 2015-04-07 (×3): 1 via TRANSDERMAL
  Filled 2015-04-04 (×7): qty 1

## 2015-04-04 MED ORDER — HYDROCHLOROTHIAZIDE 25 MG PO TABS
25.0000 mg | ORAL_TABLET | Freq: Every day | ORAL | Status: DC
  Administered 2015-04-04 – 2015-04-07 (×4): 25 mg via ORAL
  Filled 2015-04-04 (×5): qty 1

## 2015-04-04 MED ORDER — LISINOPRIL 20 MG PO TABS
20.0000 mg | ORAL_TABLET | Freq: Every day | ORAL | Status: DC
  Administered 2015-04-04 – 2015-04-07 (×4): 20 mg via ORAL
  Filled 2015-04-04 (×5): qty 1

## 2015-04-04 MED ORDER — ACETAMINOPHEN 325 MG PO TABS: 650.0000 mg | ORAL_TABLET | ORAL | Status: DC | PRN

## 2015-04-04 MED ORDER — INSULIN ASPART PROT & ASPART (70-30 MIX) 100 UNIT/ML ~~LOC~~ SUSP
24.0000 [IU] | Freq: Every day | SUBCUTANEOUS | Status: DC
  Administered 2015-04-04 – 2015-04-06 (×3): 24 [IU] via SUBCUTANEOUS

## 2015-04-04 MED ORDER — ACETAMINOPHEN 325 MG PO TABS: 650.0000 mg | ORAL_TABLET | Freq: Four times a day (QID) | ORAL | Status: DC | PRN

## 2015-04-04 MED ORDER — NICOTINE 14 MG/24HR TD PT24
14.0000 mg | MEDICATED_PATCH | Freq: Every day | TRANSDERMAL | Status: DC
  Filled 2015-04-04 (×3): qty 1

## 2015-04-04 MED ORDER — PANTOPRAZOLE SODIUM 40 MG PO TBEC
40.0000 mg | DELAYED_RELEASE_TABLET | Freq: Every day | ORAL | Status: DC
Start: 2015-04-04 — End: 2015-04-07
  Administered 2015-04-05 – 2015-04-07 (×3): 40 mg via ORAL
  Filled 2015-04-04 (×6): qty 1

## 2015-04-04 MED ORDER — CLONAZEPAM 0.5 MG PO TABS
0.5000 mg | ORAL_TABLET | Freq: Three times a day (TID) | ORAL | Status: DC
  Administered 2015-04-04 – 2015-04-07 (×11): 0.5 mg via ORAL
  Filled 2015-04-04 (×11): qty 1

## 2015-04-04 NOTE — [facility] Suicide Risk Assessment (Signed)
[facility] Admission Suicide Risk Assessment   Nursing information obtained from:  Patient Demographic factors:  Living alone, Caucasian, Low socioeconomic status, Unemployed Current Mental Status:  NA (pt denies SI) Loss Factors:  Decrease in vocational status, Decline in physical health, Financial problems / change in socioeconomic status Historical Factors:  NA Risk Reduction Factors:  Positive therapeutic relationship Total Time spent with patient: 45 minutes Principal Problem: <principal problem not specified> Diagnosis:   Patient Active Problem List   Diagnosis Date Noted  . MDD (major depressive disorder) [F32.2] 04/03/2015     Continued Clinical Symptoms:  Alcohol Use Disorder Identification Test Final Score (AUDIT): 0 The "Alcohol Use Disorders Identification Test", Guidelines for Use in Primary Care, Second Edition.  World Science writer [facility]). Score between 0-7:  no or low risk or alcohol related problems. Score between 8-15:  moderate risk of alcohol related problems. Score between 16-19:  high risk of alcohol related problems. Score 20 or above:  warrants further diagnostic evaluation for alcohol dependence and treatment.   CLINICAL FACTORS:   Panic Attacks Depression:   Severe Chronic Pain   Musculoskeletal: Strength & Muscle Tone: within normal limits Gait & Station: walks with a limp Patient leans: normal  Psychiatric Specialty Exam: Physical Exam  Review of Systems  Constitutional: Positive for malaise/fatigue.  Eyes: Negative.   Respiratory: Positive for cough.        5 a day  Cardiovascular: Positive for chest pain and palpitations.  Gastrointestinal: Positive for heartburn, nausea and vomiting.  Genitourinary: Negative.   Musculoskeletal: Positive for myalgias, back pain, joint pain and neck pain.  Skin: Negative.   Neurological: Positive for weakness and headaches.  Endo/Heme/Allergies: Negative.   Psychiatric/Behavioral: Positive for depression  and suicidal ideas. The patient is nervous/anxious and has insomnia.     Blood pressure 116/88, pulse 117, temperature 97.8 F (36.6 C), temperature source Oral, resp. rate 18, height  (1.626 m), weight 85.73 kg (189 lb), last menstrual period 04/03/2015.Body mass index is 32.43 kg/(m^2).  General Appearance: Fairly Groomed  Patent attorney::  Fair  Speech:  Clear and Coherent  Volume:  Normal  Mood:  Anxious and Depressed  Affect:  anxious depressed worried  Thought Process:  Coherent and Goal Directed  Orientation:  Full (Time, Place, and Person)  Thought Content:  symptoms events worries concerns  Suicidal Thoughts:  No  Homicidal Thoughts:  No  Memory:  Immediate;   Fair Recent;   Fair Remote;   Fair  Judgement:  Fair  Insight:  Present  Psychomotor Activity:  Restlessness  Concentration:  Fair  Recall:  [employer] of Knowledge:Fair  Language: Fair  Akathisia:  No  Handed:  Right  AIMS (if indicated):     Assets:  Desire for Improvement  Sleep:  Number of Hours: 5  Cognition: WNL  ADL's:  Intact     COGNITIVE FEATURES THAT CONTRIBUTE TO RISK:  Closed-mindedness, Polarized thinking and Thought constriction (tunnel vision)    SUICIDE RISK:   Moderate:  Frequent suicidal ideation with limited intensity, and duration, some specificity in terms of plans, no associated intent, good self-control, limited dysphoria/symptomatology, some risk factors present, and identifiable protective factors, including available and accessible social support. 44 Y/O female who states that after her father died in Nov 23, 2022 she has been increasingly more depressed. States she has chronic back pain. States that the pain makes her depression worst. Describes  depression with crying, sense of  Hopeless, helpless, insomnia. Thinks about suicide when the  pain gets bad. Also admits to anxiety, panic attacks has had twice recently. She lives with her BF in [place] what she describes as very stressful, but  admits that she has no other place to live.  States that she used to work at [employer] until December 2104. The pain and the depression makes her unable to hold a job. She state she has no income what has been stressful. She is waiting for disability PLAN OF CARE: Supportive approach/coping skills                               Depression; will start Cymbalta 30 mg with plans to increase to 60 mg daily                               Mood instability; has benefited from Depakote in the past; will start Depakote Er 500 mg daily                               Pain; will work with Neurontin but will also have Tramadol 50 mg available on a PRN basis                               Will work with CBT/mindfulness  Medical Decision Making:  Review of Psycho-Social Stressors (1), Review or order clinical lab tests (1), Review of Medication Regimen & Side Effects (2) and Review of New Medication or Change in Dosage (2)  I certify that inpatient services furnished can reasonably be expected to improve the patient's condition.   Lechelle Wrigley A 04/04/2015, 3:14 PM

## 2015-04-04 NOTE — Progress Notes (Signed)
Psychoeducational Group Note  Date:  04/04/2015 Time:  1015  Group Topic/Focus:  Making Healthy Choices:   The focus of this group is to help patients identify negative/unhealthy choices they were using prior to admission and identify positive/healthier coping strategies to replace them upon discharge.  Participation Level:  Active  Participation Quality:  Appropriate  Affect:  Appropriate  Cognitive:  Appropriate  Insight:  Improving  Engagement in Group:  Engaged  Additional Comments:    Nyomie Ehrlich A 04/04/2015 

## 2015-04-04 NOTE — Progress Notes (Signed)
Psychoeducational Group Note  Date: 04/04/2015 Time:  0930 Group Topic/Focus:  Gratefulness:  The focus of this group is to help patients identify what two things they are most grateful for in their lives. What helps ground them and to center them on their work to their recovery.  Participation Level:  Active  Participation Quality:  Appropriate  Affect:  Appropriate  Cognitive:  Oriented  Insight:  Improving  Engagement in Group:  Engaged  Additional Comments:    Hy Swiatek A  

## 2015-04-04 NOTE — [facility] Group Notes (Signed)
[facility] Group Notes:  (Clinical Social Work)  04/04/2015  1:15-2:15PM  Summary of Progress/Problems:   The main focus of today's process group was to   1)  discuss the importance of adding supports  2)  define health supports versus unhealthy supports  3)  identify the patient's current unhealthy supports and plan how to handle them  4)  Identify the patient's current healthy supports and plan what to add.  An emphasis was placed on using counselor, doctor, therapy groups, 12-step groups, and problem-specific support groups to expand supports.    The patient expressed full comprehension of the concepts presented, and agreed that there is a need to add more supports.  The patient stated she has no supports in her life now.  She does not like her doctors, does not trust them.  She states her therapist texts the whole time she is talking to her.  Her father just died, and her brother won't talk to her.  She was very tearful when talking about how bad her back hurts, and how she has people telling her she is a drug addict even though she only takes prescription medications.  She feels her boyfriend is not a support either.  She was encouraged by group members at length.  Type of Therapy:  Process Group with Motivational Interviewing  Participation Level:  Active  Participation Quality:  Attentive and Sharing  Affect:  Depressed and Tearful  Cognitive:  Alert and Appropriate  Insight:  Developing/Improving  Engagement in Therapy:  Engaged  Modes of Intervention:   Education, Support and Processing, Activity  Ambrose Mantle, LCSW 04/04/2015

## 2015-04-04 NOTE — Progress Notes (Signed)
[name] is doing better as the day goes on. She got very upset this morning because she did not know where her belongings were ( that had previously been brought to the hospital ) and they were located and given to her so she was able to calm herself down.    A She completed her daily assessment and on it she wrote she denied  SI today and she rated her depression " 10/08/08" , respectively. She spoke to the physician and hwas been given tramadol for her c/o chronic back pain and protonix has been added to her regimen.  She also is given a lidoderm pain  Patch and is due for a valporic acid level later today.   R Although she remains tearful, she says that she is getting used to being here and that she is thinking about ways she can modulate her behavior in a healthier fashion.
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
## 2015-04-04 NOTE — H&P (Signed)
Psychiatric Admission Assessment Adult  Patient Identification: Kristin Humphrey  MRN:  [id number]  Date of Evaluation:  04/04/2015  Chief Complaint:  MDD,REC,SEV  Principal Diagnosis: <principal problem not specified>  Diagnosis:   Patient Active Problem List   Diagnosis Date Noted  . MDD (major depressive disorder) [F32.2] 04/03/2015   History of Present Illness: Teryl is a 44 year old Caucasian female. Admitted to [facility] from the [facility] with complaints of worsening symptoms of depression. She reports, "My boyfriend took me to the [facility] yesterday. I was feeling so depressed, anxious & had panic attacks. I have not been able to leave my house in 2 months. I cry all the time at home. My father died in 11-15-2022 of this year. I had a fight with my brother soon after. All of these events triggered my depression to worsen. I don't even know where my father is buried up till now. He was cremated & my brother was to arrange for his final resting place there after. With my depression worsening, I almost started drinking again. My depression has been worsening since 2022/11/15 of this year. I was diagnosed with Bipolar disorder & Panic disorder 9 years ago. I was on Depakote 1000 mg  & Cymbalta 60 mg daily, but stopped taking them about 8 months ago. My nerves are shot, I have chronic pain & diabetes. I was hospitalized in a psychiatric hospital 2015 in [place]. I need to get back on my medicines while trying to get my disability application approval"  Elements:  Location:  Bipolar affective disorder. Quality:  depressed mood, high anxiety levels. Severity:  Severe. Timing:  Current. Duration:  Chronic. Context:  Depressed, panic attacks, went to the ED.  Associated Signs/Symptoms:  Depression Symptoms:  depressed mood, insomnia, feelings of worthlessness/guilt, anxiety,  (Hypo) Manic Symptoms:  Irritable Mood, Labiality of Mood,  Anxiety Symptoms:  Excessive Worry, high  anxiety  Psychotic Symptoms:  Denies  PTSD Symptoms: NA  Total Time spent with patient: 1 hour  Past Medical History:  Past Medical History  Diagnosis Date  . Depression   . Anxiety   . Diabetes mellitus without complication     Past Surgical History  Procedure Laterality Date  . Neck surgery    . Bunionectomy     Family History:  Family History  Problem Relation Age of Onset  . Heart failure Mother   . Diabetes Mother   . Cancer Father   . Cancer Other   . Diabetes Other    Social History:  History  Alcohol Use No     History  Drug Use No    Social History   Social History  . Marital Status: Single    Spouse Name: N/A  . Number of Children: N/A  . Years of Education: N/A   Social History Main Topics  . Smoking status: Current Every Day Smoker -- 0.50 packs/day    Types: Cigarettes  . Smokeless tobacco: Never Used  . Alcohol Use: No  . Drug Use: No  . Sexual Activity: No   Other Topics Concern  . None   Social History Narrative   Additional Social History: History of alcohol / drug use?: No history of alcohol / drug abuse  Musculoskeletal: Strength & Muscle Tone: within normal limits Gait & Station: unsteady, walks with a cane Patient leans: N/A  Psychiatric Specialty Exam: Physical Exam  Constitutional: She is oriented to person, place, and time. She appears well-developed and well-nourished.  HENT:  Head: Normocephalic.  Eyes: Pupils are equal, round, and reactive to light.  Neck: Normal range of motion.  Cardiovascular: Normal rate.   Respiratory: Effort normal.  GI: Soft.  Genitourinary:  Denies any issues in this area   Musculoskeletal: She exhibits tenderness (Bck area).  Walks with a cane   Neurological: She is alert and oriented to person, place, and time.  Skin: Skin is warm and dry.  Psychiatric: Her speech is normal and behavior is normal. Judgment and thought content normal. Her mood appears anxious. Her affect is not  angry, not blunt, not labile and not inappropriate. Cognition and memory are normal. She exhibits a depressed mood.    Review of Systems  Constitutional: Negative.   HENT: Negative.   Eyes: Negative.   Respiratory: Negative.   Cardiovascular: Negative.   Gastrointestinal: Negative.   Genitourinary: Negative.   Musculoskeletal: Positive for myalgias, back pain and joint pain.  Skin: Negative.   Endo/Heme/Allergies: Negative.   Psychiatric/Behavioral: Positive for depression. Negative for suicidal ideas, hallucinations, memory loss and substance abuse. The patient is nervous/anxious and has insomnia.     Blood pressure 116/88, pulse 117, temperature 97.8 F (36.6 C), temperature source Oral, resp. rate 18, height 5\' 4"  (1.626 m), weight 85.73 kg (189 lb), last menstrual period 04/03/2015.Body mass index is 32.43 kg/(m^2).  General Appearance: Casual and walks with a cane  Eye Contact::  Good  Speech:  Clear and Coherent  Volume:  Normal  Mood:  Anxious, Depressed and tearful  Affect:  Non-Congruent and Tearful  Thought Process:  Coherent and Intact  Orientation:  Full (Time, Place, and Person)  Thought Content:  Rumination and denies any hallucinations, delusional thoughts or paranoia  Suicidal Thoughts:  No  Homicidal Thoughts:  No  Memory:  Grossly intact  Judgement:  Good  Insight:  Present  Psychomotor Activity:  Normal  Concentration:  Fair  Recall:  Good  Fund of Knowledge:Fair  Language: Good  Akathisia:  No  Handed:  Right  AIMS (if indicated):     Assets:  Communication Skills Desire for Improvement  ADL's:  Intact  Cognition: WNL  Sleep:  Number of Hours: 5   Risk to Self: Is patient at risk for suicide?: Yes Risk to Others: No Prior Inpatient Therapy: Yes Prior Outpatient Therapy: Yes  Alcohol Screening: 1. How often do you have a drink containing alcohol?: Never 9. Have you or someone else been injured as a result of your drinking?: No 10. Has a relative  or friend or a doctor or another health worker been concerned about your drinking or suggested you cut down?: No Alcohol Use Disorder Identification Test Final Score (AUDIT): 0  Allergies:   Allergies  Allergen Reactions  . Adhesive [Tape] Other (See Comments)    Causes blisters and redness. Prefers paper tape   . Celebrex [Celecoxib] Other (See Comments)    Pt reports is makes her"hurt worse"  . Zomig [Zolmitriptan] Other (See Comments)    Chest tightness   Lab Results:  Results for orders placed or performed during the hospital encounter of 04/03/15 (from the past 48 hour(s))  Glucose, capillary     Status: Abnormal   Collection Time: 04/04/15  5:58 AM  Result Value Ref Range   Glucose-Capillary 266 (H) 65 - 99 mg/dL   Current Medications: Current Facility-Administered Medications  Medication Dose Route Frequency Provider Last Rate Last Dose  . acetaminophen (TYLENOL) tablet 650 mg  650 mg Oral Q6H PRN Rockey Situ  Cobos, MD      . alum & mag hydroxide-simeth (MAALOX/MYLANTA) 200-200-20 MG/5ML suspension 30 mL  30 mL Oral Q4H PRN Court Joy, PA-C      . baclofen (LIORESAL) tablet 10 mg  10 mg Oral BID Court Joy, PA-C   10 mg at 04/04/15 0800  . clonazePAM (KLONOPIN) tablet 0.5 mg  0.5 mg Oral TID Court Joy, PA-C   0.5 mg at 04/04/15 1258  . divalproex (DEPAKOTE) DR tablet 500 mg  500 mg Oral Q12H Sanjuana Kava, NP      . DULoxetine (CYMBALTA) DR capsule 30 mg  30 mg Oral Daily Sanjuana Kava, NP      . gabapentin (NEURONTIN) capsule 800 mg  800 mg Oral QID Court Joy, PA-C   800 mg at 04/04/15 1258  . lisinopril (PRINIVIL,ZESTRIL) tablet 20 mg  20 mg Oral Daily Court Joy, PA-C   20 mg at 04/04/15 0800   And  . hydrochlorothiazide (HYDRODIURIL) tablet 25 mg  25 mg Oral Daily Court Joy, PA-C   25 mg at 04/04/15 0800  . ibuprofen (ADVIL,MOTRIN) tablet 800 mg  800 mg Oral Q6H PRN Court Joy, PA-C      . insulin aspart (novoLOG) injection 0-15 Units   0-15 Units Subcutaneous TID WC Sanjuana Kava, NP      . insulin aspart protamine- aspart (NOVOLOG MIX 70/30) injection 24 Units  24 Units Subcutaneous Q supper Court Joy, PA-C      . insulin glargine (LANTUS) injection 10 Units  10 Units Subcutaneous QHS Court Joy, PA-C      . levothyroxine (SYNTHROID, LEVOTHROID) tablet 100 mcg  100 mcg Oral QAC breakfast Court Joy, PA-C   100 mcg at 04/04/15 0800  . lidocaine (LIDODERM) 5 % 1 patch  1 patch Transdermal Q24H Sanjuana Kava, NP      . nicotine (NICODERM CQ - dosed in mg/24 hours) patch 14 mg  14 mg Transdermal Daily Court Joy, PA-C   14 mg at 04/04/15 0800  . traZODone (DESYREL) tablet 50 mg  50 mg Oral QHS Sanjuana Kava, NP       PTA Medications: Prescriptions prior to admission  Medication Sig Dispense Refill Last Dose  . acetaminophen (TYLENOL) 500 MG tablet Take 2,500 mg by mouth every 6 (six) hours as needed for mild pain.   Past Week  . baclofen (LIORESAL) 10 MG tablet Take 10 mg by mouth 2 (two) times daily.     . clonazePAM (KLONOPIN) 0.5 MG tablet Take 0.5 mg by mouth 3 (three) times daily.     Marland Kitchen gabapentin (NEURONTIN) 800 MG tablet Take 800 mg by mouth 4 (four) times daily.     Marland Kitchen ibuprofen (ADVIL,MOTRIN) 200 MG tablet Take 800 mg by mouth every 6 (six) hours as needed.   Past Week  . insulin glargine (LANTUS) 100 UNIT/ML injection Inject 10 Units into the skin at bedtime.   Past Week  . insulin lispro protamine-lispro (HUMALOG 75/25 MIX) (75-25) 100 UNIT/ML SUSP injection Inject 24 Units into the skin at bedtime.   Past Week  . levothyroxine (SYNTHROID, LEVOTHROID) 100 MCG tablet Take 100 mcg by mouth daily before breakfast.   Past Week  . lisinopril-hydrochlorothiazide (PRINZIDE,ZESTORETIC) 20-25 MG per tablet Take 1 tablet by mouth daily.   Past Week  . Multiple Vitamin (MULTIVITAMIN WITH MINERALS) TABS tablet Take 1 tablet by mouth daily.   Past Week  .  Vitamin D, Ergocalciferol, (DRISDOL) 50000 UNITS CAPS  capsule Take 50,000 Units by mouth every 7 (seven) days. Usually takes on Sunday   03/28/2015   Previous Psychotropic Medications: Yes   Substance Abuse History in the last 12 months:  Yes.   (Cigarettes)  Consequences of Substance Abuse: Medical Consequences:  Liver damage, Possible death by overdose Legal Consequences:  Arrests, jail time, Loss of driving privilege. Family Consequences:  Family discord, divorce and or separation.  Results for orders placed or performed during the hospital encounter of 04/03/15 (from the past 72 hour(s))  Glucose, capillary     Status: Abnormal   Collection Time: 04/04/15  5:58 AM  Result Value Ref Range   Glucose-Capillary 266 (H) 65 - 99 mg/dL    Observation Level/Precautions:  15 minute checks  Laboratory:  Per ED  Psychotherapy: Group sessions  Medications:  Depakote 500 mg, Cymbalta 30 mg, Klonopin 0.5 mg, insulin therapy, HCTZ 25 mg, Lisinopril 20 mg, Trazodone 50 mg, gabapentin 800 mg, baclofen 10 mg  Consultations: As needed  Discharge Concerns: Safety, mood stability  Estimated LOS: 3-5 days  Other:     Psychological Evaluations: Yes   Treatment Plan Summary: Daily contact with patient to assess and evaluate symptoms and progress in treatment and Medication management: 1. Admit for crisis management and stabilization, estimated length of stay 3-5 days.  2. Medication management to reduce current symptoms to base line and improve the patient's overall level of functioning; Initiate Depakote Dr for mood stabilization, Cymbalta 30 mg for depression, Trazodone 50 mg for sleep, resume Klonopin 0.5 mg tid for anxiety, Obtain Depakote level on 04-07-15 3. Treat health problems as indicated.  4. Develop treatment plan to decrease risk of relapse upon discharge and the need for readmission.  5. Psycho-social education regarding relapse prevention and self care.  6. Health care follow up as needed for medical problems.  7. Review, reconcile, and  reinstate any pertinent home medications for other health issues where appropriate; resume HCTZ-Lisinopril 25/20 mg for HTN, Baclofen 10 mg for muscle spasms, Gabapentin 800 mg for neuropathic pain, insulin shots for diabetes management, synthroid 100 mg hypothyroidsm,  8. Call for consults with hospitalist for any additional specialty patient care services as needed.  Medical Decision Making:  New problem, with additional work up planned, Review or order clinical lab tests (1), Review and summation of old records (2), Review of Medication Regimen & Side Effects (2) and Review of New Medication or Change in Dosage (2)  I certify that inpatient services furnished can reasonably be expected to improve the patient's condition.   Sanjuana Kava, PMHNP, FNP-BC 8/28/20162:00 PM I personally assessed the patient, reviewed the physical exam and labs and formulated the treatment plan Madie Reno A. Dub Mikes, M.D.

## 2015-04-04 NOTE — Progress Notes (Signed)
Admission Note: Patient admitted voluntarily for depression, anxiety and suicidal ideation related to her chronic back pain. Pt states she has had generalized anxiety and depression since the age of 41, but that it has been extremely bad since her father passed away three months ago. Pt states that she has had multiple panic attacks and has found it more difficult to leave her home. Pt states she is also depressed because of her chronic back pain and is trying to get qualified for disability. Pt states she is worried because her insurance will run out in a couple of months due to a recent loss of her employment. Pt drowsy on admission due to receiving medications in the ER prior to arrival to [facility]. Pt denies SI or plans to harm herself and states she did not mean what she said before she came here, but that she does need help for her depression. No s/s of distress noted at this time.

## 2015-04-05 LAB — GLUCOSE, CAPILLARY
GLUCOSE-CAPILLARY: 234 mg/dL — AB (ref 65–99)
GLUCOSE-CAPILLARY: 127 mg/dL — AB (ref 65–99)
Glucose-Capillary: 201 mg/dL — ABNORMAL HIGH (ref 65–99)
Glucose-Capillary: 224 mg/dL — ABNORMAL HIGH (ref 65–99)

## 2015-04-05 MED ORDER — DULOXETINE HCL 20 MG PO CPEP
40.0000 mg | ORAL_CAPSULE | Freq: Every day | ORAL | Status: DC
  Administered 2015-04-06: 40 mg via ORAL
  Filled 2015-04-05 (×2): qty 2

## 2015-04-05 MED ORDER — DICLOFENAC SODIUM 1 % TD GEL
4.0000 g | Freq: Four times a day (QID) | TRANSDERMAL | Status: DC
  Administered 2015-04-05 – 2015-04-06 (×4): 4 g via TOPICAL
  Filled 2015-04-05 (×2): qty 100

## 2015-04-05 NOTE — Progress Notes (Signed)
D:Pt rates anxiety as a 2 and depression as a 3 on 1-10 scale with 10 being the most. Pt reports that the groups are helping and her depression has decreased since admission. Pt reports being upset toward her brother since her father passed. She says that her brother has two homes and she does not know where her father is buried.  A: Offered support and 15 minute checks. Explored options such as a prayer garden to honor her father. R:Pt denies si and hi thoughts. Safety maintained on the unit.

## 2015-04-05 NOTE — Tx Team (Signed)
Initial Interdisciplinary Treatment Plan   PATIENT STRESSORS: Financial difficulties Health problems Traumatic event   PATIENT STRENGTHS: Ability for insight Active sense of humor Average or above average intelligence Capable of independent living General fund of knowledge Motivation for treatment/growth   PROBLEM LIST: Problem List/Patient Goals Date to be addressed Date deferred Reason deferred Estimated date of resolution  "Feel depressed" 04/04/15     Suicidal Ideation 04/04/15     Depression 04/04/15                                          DISCHARGE CRITERIA:  Ability to meet basic life and health needs Improved stabilization in mood, thinking, and/or behavior Verbal commitment to aftercare and medication compliance  PRELIMINARY DISCHARGE PLAN: Attend aftercare/continuing care group  PATIENT/FAMIILY INVOLVEMENT: This treatment plan has been presented to and reviewed with the patient, Kristin Humphrey, and/or family member, .  The patient and family have been given the opportunity to ask questions and make suggestions.  Keane Martelli, [place] 04/05/2015, 1:37 AM

## 2015-04-05 NOTE — Progress Notes (Signed)
[facility] MD Progress Note  04/05/2015 6:41 PM Kristin Humphrey  MRN:  [id number] Subjective:  Kristin Humphrey continues to express that she is having a lot of pain, anxiety and depression. She was just started back on her medications and she knows they take time before they work. She is having her BF put pressure on her to come home. States this is a very stressful relationship; she is also frustrated with her pain as states that not much can be done according to the physicians she has seen Principal Problem: Severe recurrent major depression without psychotic features Diagnosis:   Patient Active Problem List   Diagnosis Date Noted  . Severe recurrent major depression without psychotic features [F33.2] 04/04/2015  . GAD (generalized anxiety disorder) [F41.1] 04/04/2015   Total Time spent with patient: 30 minutes   Past Medical History:  Past Medical History  Diagnosis Date  . Depression   . Anxiety   . Diabetes mellitus without complication     Past Surgical History  Procedure Laterality Date  . Neck surgery    . Bunionectomy     Family History:  Family History  Problem Relation Age of Onset  . Heart failure Mother   . Diabetes Mother   . Cancer Father   . Cancer Other   . Diabetes Other    Social History:  History  Alcohol Use No     History  Drug Use No    Social History   Social History  . Marital Status: Single    Spouse Name: N/A  . Number of Children: N/A  . Years of Education: N/A   Social History Main Topics  . Smoking status: Current Every Day Smoker -- 0.50 packs/day    Types: Cigarettes  . Smokeless tobacco: Never Used  . Alcohol Use: No  . Drug Use: No  . Sexual Activity: No   Other Topics Concern  . None   Social History Narrative   Additional History:    Sleep: Fair  Appetite:  Fair   Assessment:   Musculoskeletal: Strength & Muscle Tone: within normal limits Gait & Station: normal Patient leans: normal   Psychiatric Specialty Exam: Physical  Exam  Review of Systems  Constitutional: Negative.   HENT: Negative.   Eyes: Negative.   Respiratory: Negative.   Cardiovascular: Negative.   Gastrointestinal: Negative.   Genitourinary: Negative.   Musculoskeletal: Positive for back pain and joint pain.  Skin: Negative.   Neurological: Negative.   Endo/Heme/Allergies: Negative.   Psychiatric/Behavioral: Positive for depression. The patient is nervous/anxious.     Blood pressure 100/68, pulse 96, temperature 97.7 F (36.5 C), temperature source Oral, resp. rate 16, height 5\' 4"  (1.626 m), weight 85.73 kg (189 lb), last menstrual period 04/03/2015.Body mass index is 32.43 kg/(m^2).  General Appearance: Fairly Groomed  Patent attorney::  Fair  Speech:  Clear and Coherent  Volume:  fluctuates  Mood:  Anxious and Depressed  Affect:  Labile  Thought Process:  Coherent and Goal Directed  Orientation:  Full (Time, Place, and Person)  Thought Content:  symptoms envents worries concerns  Suicidal Thoughts:  No  Homicidal Thoughts:  No  Memory:  Immediate;   Fair Recent;   Fair Remote;   Fair  Judgement:  Fair  Insight:  Present  Psychomotor Activity:  Restlessness  Concentration:  Fair  Recall:  [employer] of Knowledge:Fair  Language: Fair  Akathisia:  No  Handed:  Right  AIMS (if indicated):     Assets:  Desire for Improvement  ADL's:  Intact  Cognition: WNL  Sleep:  Number of Hours: 5     Current Medications: Current Facility-Administered Medications  Medication Dose Route Frequency Provider Last Rate Last Dose  . acetaminophen (TYLENOL) tablet 650 mg  650 mg Oral Q6H PRN Craige Cotta, MD      . alum & mag hydroxide-simeth (MAALOX/MYLANTA) 200-200-20 MG/5ML suspension 30 mL  30 mL Oral Q4H PRN Court Joy, PA-C      . baclofen (LIORESAL) tablet 10 mg  10 mg Oral BID Court Joy, PA-C   10 mg at 04/05/15 1617  . clonazePAM (KLONOPIN) tablet 0.5 mg  0.5 mg Oral TID Court Joy, PA-C   0.5 mg at 04/05/15 1617   . diclofenac sodium (VOLTAREN) 1 % transdermal gel 4 g  4 g Topical QID Rachael Fee, MD   4 g at 04/05/15 1617  . divalproex (DEPAKOTE) DR tablet 500 mg  500 mg Oral Q12H Sanjuana Kava, NP   500 mg at 04/05/15 0803  . DULoxetine (CYMBALTA) DR capsule 30 mg  30 mg Oral Daily Sanjuana Kava, NP   30 mg at 04/05/15 0803  . gabapentin (NEURONTIN) capsule 800 mg  800 mg Oral QID Court Joy, PA-C   800 mg at 04/05/15 1617  . lisinopril (PRINIVIL,ZESTRIL) tablet 20 mg  20 mg Oral Daily Court Joy, PA-C   20 mg at 04/05/15 1610   And  . hydrochlorothiazide (HYDRODIURIL) tablet 25 mg  25 mg Oral Daily Court Joy, PA-C   25 mg at 04/05/15 0802  . ibuprofen (ADVIL,MOTRIN) tablet 800 mg  800 mg Oral Q6H PRN Court Joy, PA-C      . insulin aspart (novoLOG) injection 0-15 Units  0-15 Units Subcutaneous TID WC Sanjuana Kava, NP   5 Units at 04/05/15 1730  . insulin aspart protamine- aspart (NOVOLOG MIX 70/30) injection 24 Units  24 Units Subcutaneous Q supper Court Joy, PA-C   24 Units at 04/05/15 1732  . insulin glargine (LANTUS) injection 10 Units  10 Units Subcutaneous QHS Court Joy, PA-C   10 Units at 04/04/15 2127  . levothyroxine (SYNTHROID, LEVOTHROID) tablet 100 mcg  100 mcg Oral QAC breakfast Court Joy, PA-C   100 mcg at 04/05/15 0636  . lidocaine (LIDODERM) 5 % 1 patch  1 patch Transdermal Q24H Sanjuana Kava, NP   1 patch at 04/05/15 0809  . nicotine polacrilex (NICORETTE) gum 2 mg  2 mg Oral PRN Rachael Fee, MD   2 mg at 04/05/15 1816  . pantoprazole (PROTONIX) EC tablet 40 mg  40 mg Oral Daily Rachael Fee, MD   40 mg at 04/05/15 0802  . traMADol (ULTRAM) tablet 50 mg  50 mg Oral Q6H PRN Rachael Fee, MD   50 mg at 04/05/15 1624  . traZODone (DESYREL) tablet 50 mg  50 mg Oral QHS Sanjuana Kava, NP   50 mg at 04/04/15 2123    Lab Results:  Results for orders placed or performed during the hospital encounter of 04/03/15 (from the past 48 hour(s))  Glucose,  capillary     Status: Abnormal   Collection Time: 04/04/15  5:58 AM  Result Value Ref Range   Glucose-Capillary 266 (H) 65 - 99 mg/dL  Glucose, capillary     Status: Abnormal   Collection Time: 04/04/15  5:07 PM  Result Value Ref Range   Glucose-Capillary  236 (H) 65 - 99 mg/dL   Comment 1 Notify RN    Comment 2 Document in Chart   Glucose, capillary     Status: Abnormal   Collection Time: 04/04/15  8:53 PM  Result Value Ref Range   Glucose-Capillary 246 (H) 65 - 99 mg/dL  Glucose, capillary     Status: Abnormal   Collection Time: 04/05/15  6:11 AM  Result Value Ref Range   Glucose-Capillary 201 (H) 65 - 99 mg/dL  Glucose, capillary     Status: Abnormal   Collection Time: 04/05/15 11:51 AM  Result Value Ref Range   Glucose-Capillary 224 (H) 65 - 99 mg/dL  Glucose, capillary     Status: Abnormal   Collection Time: 04/05/15  5:08 PM  Result Value Ref Range   Glucose-Capillary 234 (H) 65 - 99 mg/dL    Physical Findings: AIMS: Facial and Oral Movements Muscles of Facial Expression: None, normal Lips and Perioral Area: None, normal Jaw: None, normal Tongue: None, normal,Extremity Movements Upper (arms, wrists, hands, fingers): None, normal Lower (legs, knees, ankles, toes): None, normal, Trunk Movements Neck, shoulders, hips: None, normal, Overall Severity Severity of abnormal movements (highest score from questions above): None, normal Incapacitation due to abnormal movements: None, normal Patient's awareness of abnormal movements (rate only patient's report): No Awareness, Dental Status Current problems with teeth and/or dentures?: No Does patient usually wear dentures?: No  CIWA:    COWS:     Treatment Plan Summary: Daily contact with patient to assess and evaluate symptoms and progress in treatment and Medication management Supportive approach/copign skills Depression; will continue to work with the Cymbalta and increase to 40 mg  Pain; will continue to work with the  Tramadol/Neurontin Anxiety-panic; will work with the Klonopin CBT/mindfulness Medical Decision Making:  Review of Psycho-Social Stressors (1) and Review of Medication Regimen & Side Effects (2)     Khanh Tanori A 04/05/2015, 6:41 PM

## 2015-04-05 NOTE — B[facility] Group Notes (Signed)
   [facility] LCSW Aftercare Discharge Planning Group Note  04/05/2015  8:45 AM   Participation Quality: Alert, Appropriate and Oriented  Mood/Affect: Appropriate  Depression Rating: 3  Anxiety Rating: 3  Thoughts of Suicide: Pt denies SI/HI  Will you contract for safety? Yes  Current AVH: Pt denies  Plan for Discharge/Comments: Pt attended discharge planning group and actively participated in group. CSW provided pt with today's workbook. Patient plans to return home to follow up with outpatient services.  Transportation Means: Pt reports access to transportation  Supports: No supports mentioned at this time  Samuella Bruin, MSW, [employer] Clinical Social Worker [employer] [phone]

## 2015-04-05 NOTE — Progress Notes (Signed)
Recreation Therapy Notes  Date: 08.29.2016 Time: 9:30am Location: [address] Group Room   Group Topic: Stress Management  Goal Area(s) Addresses:  Patient will actively participate in stress management techniques presented during session.   Behavioral Response: Engaged, Appropriate.   Intervention: Stress management techniques  Activity :  Deep Breathing and Progressive Body Scan. LRT provided instruction and demonstration on practice of Progressive Body Scan. Technique was coupled with deep breathing.   Education:  Stress Management, Discharge Planning.   Education Outcome: Acknowledges education  Clinical Observations/Feedback: Patient engaged in session, demonstrating ability to practice independently and expressing no concerns.    Marykay Lex Murline Weigel, LRT/CTRS  Aleiyah Halpin L 04/05/2015 1:42 PM

## 2015-04-05 NOTE — Progress Notes (Signed)
D. Pt had been up and visible in milieu this evening, did attend and participate in evening group activity. Pt has endorsed feelings of depression and does indeed appear to be depressed on the unit. Pt spoke briefly of her situation and what led her to being hospitalized. Pt received medications without incident, did verbalize complaints of pain in back but did chose not to receive any type of therapy for it. A. Support and encouragement provided. R. Safety maintained, will continue to monitor.

## 2015-04-05 NOTE — [facility] Group Notes (Signed)
[facility] LCSW Group Therapy 04/05/2015  1:15 pm  Type of Therapy: Group Therapy Participation Level: Active  Participation Quality: Attentive, Sharing and Supportive  Affect: Appropriate  Cognitive: Alert and Oriented  Insight: Developing/Improving and Engaged  Engagement in Therapy: Developing/Improving and Engaged  Modes of Intervention: Clarification, Confrontation, Discussion, Education, Exploration,  Limit-setting, Orientation, Problem-solving, Rapport Building, Dance movement psychotherapist, Socialization and Support  Summary of Progress/Problems: Pt identified obstacles faced currently and processed barriers involved in overcoming these obstacles. Pt identified steps necessary for overcoming these obstacles and explored motivation (internal and external) for facing these difficulties head on. Pt further identified one area of concern in their lives and chose a goal to focus on for today. Patient discussed lack of support, pain issues, and isolation as obstacles. CSW and group members discussed ways in which to find support in the community as well as provided patient with emotional support and encouragement.  Samuella Bruin, MSW, [employer] Clinical Social Worker [facility] [phone]

## 2015-04-05 NOTE — Plan of Care (Signed)
Problem: Alteration in mood; excessive anxiety as evidenced by: Goal: LTG-Patient's behavior demonstrates decreased anxiety (Patient's behavior demonstrates anxiety and he/she is utilizing learned coping skills to deal with anxiety-producing situations)  Outcome: Progressing Pt reports a decrease in anxiety since coming to the hospital. She rates anxiety as a 2 on 1-10 scale.

## 2015-04-05 NOTE — [facility] Suicide Risk Assessment (Signed)
[facility] INPATIENT:  Family/Significant Other Suicide Prevention Education  Suicide Prevention Education:  Contact Attempts: boyfriend Kristin Humphrey [phone], (name of family member/significant other) has been identified by the patient as the family member/significant other with whom the patient will be residing, and identified as the person(s) who will aid the patient in the event of a mental health crisis.  With written consent from the patient, two attempts were made to provide suicide prevention education, prior to and/or following the patient's discharge.  We were unsuccessful in providing suicide prevention education.  A suicide education pamphlet was given to the patient to share with family/significant other.  Date and time of first attempt: 04/05/15 at 1pm Date and time of second attempt: 04/06/15 at 3:30pm  Kristin Humphrey, [name] 04/05/2015, 1:03 PM

## 2015-04-06 LAB — GLUCOSE, CAPILLARY
Glucose-Capillary: 134 mg/dL — ABNORMAL HIGH (ref 65–99)
Glucose-Capillary: 182 mg/dL — ABNORMAL HIGH (ref 65–99)
Glucose-Capillary: 221 mg/dL — ABNORMAL HIGH (ref 65–99)

## 2015-04-06 MED ORDER — DULOXETINE HCL 60 MG PO CPEP
60.0000 mg | ORAL_CAPSULE | Freq: Every day | ORAL | Status: DC
  Administered 2015-04-07: 60 mg via ORAL
  Filled 2015-04-06 (×2): qty 1

## 2015-04-06 NOTE — Progress Notes (Signed)
D. Pt had been up and visible in milieu this evening, did attend and participate in evening group activity. Pt has endorsed depression and has appeared depressed while in the milieu this evening. Pt also spoke about on-going pain and did report some relief with the ultram. Pt received medications tonight without incident but chose not to take trazodone as pt reported that she felt she did not need this medication tonight. A. Support and encouragement provided. R. Safety maintained, will continue to monitor.

## 2015-04-06 NOTE — [facility] Group Notes (Signed)
[facility] LCSW Group Therapy  04/06/2015   1:15 PM   Type of Therapy:  Group Therapy  Participation Level:  Active  Participation Quality:  Attentive, Sharing and Supportive  Affect:  Appropriate  Cognitive:  Alert and Oriented  Insight:  Developing/Improving and Engaged  Engagement in Therapy:  Developing/Improving and Engaged  Modes of Intervention:  Clarification, Confrontation, Discussion, Education, Exploration, Limit-setting, Orientation, Problem-solving, Rapport Building, Dance movement psychotherapist, Socialization and Support  Summary of Progress/Problems: The topic for group therapy was feelings about diagnosis.  Pt actively participated in group discussion on their past and current diagnosis and how they feel towards this.  Pt also identified how society and family members judge them, based on their diagnosis as well as stereotypes and stigmas.  Patient discussed feeling that her family did not understand her illness and identified several stigmas related to mental illness. She shared that her diagnosis has been helpful for her in learning more about her illness and types of treatment available. CSW and other group members provided patient with emotional support and encouragement.  Samuella Bruin, MSW, [employer] Clinical Social Worker [facility] [phone]

## 2015-04-06 NOTE — Progress Notes (Signed)
Adult Psychoeducational Group Note  Date:  04/06/2015 Time:  9:42 PM  Group Topic/Focus:  Wrap-Up Group:   The focus of this group is to help patients review their daily goal of treatment and discuss progress on daily workbooks.  Participation Level:  Active  Participation Quality:  Appropriate and limited  Affect:  Appropriate  Cognitive:  Alert  Insight: Appropriate  Engagement in Group:  Engaged  Modes of Intervention:  Discussion  Additional Comments:  Patient was limited. She stated she had a good day. Patient is ready for discharge tomorrow.   Kristin Humphrey [name] 04/06/2015, 9:42 PM

## 2015-04-06 NOTE — Progress Notes (Signed)
Patient ID: Kristin Humphrey, female   DOB: 09-15-1970, 44 y.o.   MRN: [id number]  Pt currently presents with an anxious affect and cooperative behavior. Pt remains active in the milieu today. Pt present in activities and groups. Pt able to express investment in follow up plans that include "take medications, find a support person to talk with and eat less sugar." Pt provided with medications per providers orders. Pt's labs and vitals were monitored throughout the day. Pt supported emotionally and encouraged to express concerns and questions. Pt educated on medications. Pt educated on diabetes, food and nutrition.  Pt's safety ensured with 15 minute and environmental checks. Pt currently denies SI/HI and A/V hallucinations. Pt verbally agrees to seek staff if SI/HI or A/VH occurs and to consult with staff before acting on these thoughts. Pt to be discharged tomorrow. Will continue POC.

## 2015-04-06 NOTE — Tx Team (Signed)
Interdisciplinary Treatment Plan Update (Adult) Date: 04/06/2015   Time Reviewed: 9:30 AM  Progress in Treatment: Attending groups: Yes Participating in groups: Yes Taking medication as prescribed: Yes Tolerating medication: Yes Family/Significant other contact made: No, CSW assessing for appropriate contacts Patient understands diagnosis: Yes Discussing patient identified problems/goals with staff: Yes Medical problems stabilized or resolved: Yes Denies suicidal/homicidal ideation: Yes Issues/concerns per patient self-inventory: Yes Other:  New problem(s) identified: N/A  Discharge Plan or Barriers: Home with outpatient services yet to be determined.    Reason for Continuation of Hospitalization:  Depression Anxiety Medication Stabilization   Comments: N/A  Estimated length of stay: 1-2 days   Kristin Humphrey is 44yo female from [place] who lives in chronic back pain due to a work incident, can become tearful and suicidal when it is continuous and uncontrollable. Her father died in 12/21/2014 and her brother who is executor of the estate will not speak with her, and she reports he will not even tell her where parents are buried. When father was first diagnosed, she did write some bad checks on father's account, so brother had a restraining order on her. She lives with boyfriend who complains all the time about supporting her financially. She has applied for disability, is waiting for outcome, signed a release to lawyer for records to be sent there. She does not like her current therapist who texts throughout sessions, and does not trust current doctors. She would like to use her [place] address to go to [facility] for psychiatry and therapy. The patient would benefit from safety monitoring, medication evaluation, psychoeducation, group therapy, and discharge planning to link with ongoing resources. The patient declined referral to [facility] for smoking  cessation. The Discharge Process and Patient Involvement form was reviewed with patient at the end of the Psychosocial Assessment, and the patient confirmed understanding and signed that document, which was placed in the paper chart. Suicide Prevention Education was reviewed thoroughly, and a brochure left with patient. The patient signed consent for SPE to be provided to boyfriend Julio Alm [phone], who is only home after 5pm, will pick her up from hospital.   Review of initial/current patient goals per problem list:  1. Goal(s): Patient will participate in aftercare plan   Met: Yes   Target date: 3-5 days post admission date   As evidenced by: Patient will participate within aftercare plan AEB aftercare provider and housing plan at discharge being identified.  8/30: Goal met: Patient plans to return to previous living situation to follow up with providers yet to be determined.     2. Goal (s): Patient will exhibit decreased depressive symptoms and suicidal ideations.   Met: Yes   Target date: 3-5 days post admission date   As evidenced by: Patient will utilize self rating of depression at 3 or below and demonstrate decreased signs of depression or be deemed stable for discharge by MD.  8/29: Goal met: Patient rates depression at 3 today.      3. Goal(s): Patient will demonstrate decreased signs and symptoms of anxiety.   Met: Yes   Target date: 3-5 days post admission date   As evidenced by: Patient will utilize self rating of anxiety at 3 or below and demonstrated decreased signs of anxiety, or be deemed stable for discharge by MD  8/29: Goal met: Patient rates anxiety at 3 today.      Attendees: Patient:    Family:    Physician: Dr. Shea Evans; Dr. Sabra Heck 04/06/2015 9:30  AM  Nursing: Dani Gobble, Donata Duff RN 04/06/2015 9:30 AM  Clinical Social Worker: Tilden Fossa, [place] 04/06/2015 9:30 AM  Other: Maxie Better,  LCSWA 04/06/2015 9:30 AM  Other:  04/06/2015 9:30 AM  Other: Lars Pinks, Case Manager 04/06/2015 9:30 AM  Other: Ave Filter, NP 04/06/2015 9:30 AM  Other:    Other:           Scribe for Treatment Team:  Tilden Fossa, MSW, [place] [phone]

## 2015-04-06 NOTE — Progress Notes (Signed)
[facility] MD Progress Note  04/06/2015 12:51 PM Kristin Humphrey  MRN:  [id number] Subjective:  States she is still feeling down depressed. States the pain gets her down. She states that she feels hopeless as it seems the physicians are not offering anything different but having to live with the pain what she states keeps her from doing the things that she used to like to do before. She is tolerating the medications well. Principal Problem: Severe recurrent major depression without psychotic features Diagnosis:   Patient Active Problem List   Diagnosis Date Noted  . Severe recurrent major depression without psychotic features [F33.2] 04/04/2015  . GAD (generalized anxiety disorder) [F41.1] 04/04/2015   Total Time spent with patient: 30 minutes   Past Medical History:  Past Medical History  Diagnosis Date  . Depression   . Anxiety   . Diabetes mellitus without complication     Past Surgical History  Procedure Laterality Date  . Neck surgery    . Bunionectomy     Family History:  Family History  Problem Relation Age of Onset  . Heart failure Mother   . Diabetes Mother   . Cancer Father   . Cancer Other   . Diabetes Other    Social History:  History  Alcohol Use No     History  Drug Use No    Social History   Social History  . Marital Status: Single    Spouse Name: N/A  . Number of Children: N/A  . Years of Education: N/A   Social History Main Topics  . Smoking status: Current Every Day Smoker -- 0.50 packs/day    Types: Cigarettes  . Smokeless tobacco: Never Used  . Alcohol Use: No  . Drug Use: No  . Sexual Activity: No   Other Topics Concern  . None   Social History Narrative   Additional History:    Sleep: Fair  Appetite:  Fair   Assessment:   Musculoskeletal: Strength & Muscle Tone: within normal limits Gait & Station: normal Patient leans: normal   Psychiatric Specialty Exam: Physical Exam  Review of Systems  Constitutional: Negative.   HENT:  Negative.   Eyes: Negative.   Respiratory: Negative.   Cardiovascular: Negative.   Gastrointestinal: Negative.   Genitourinary: Negative.   Musculoskeletal: Positive for back pain.  Skin: Negative.   Neurological: Negative.   Endo/Heme/Allergies: Negative.   Psychiatric/Behavioral: Positive for depression. The patient is nervous/anxious.     Blood pressure 106/72, pulse 88, temperature 97.7 F (36.5 C), temperature source Oral, resp. rate 16, height 5\' 4"  (1.626 m), weight 85.73 kg (189 lb), last menstrual period 04/03/2015.Body mass index is 32.43 kg/(m^2).  General Appearance: Fairly Groomed  Patent attorney::  Fair  Speech:  Clear and Coherent  Volume:  Normal  Mood:  Anxious, Depressed and worried  Affect:  anxious worried sad  Thought Process:  Coherent and Goal Directed  Orientation:  Full (Time, Place, and Person)  Thought Content:  symptoms worries concerns  Suicidal Thoughts:  No  Homicidal Thoughts:  No  Memory:  Immediate;   Fair Recent;   Fair Remote;   Fair  Judgement:  Fair  Insight:  Present and Shallow  Psychomotor Activity:  Decreased  Concentration:  Fair  Recall:  [employer] of Knowledge:Fair  Language: Fair  Akathisia:  No  Handed:  Right  AIMS (if indicated):     Assets:  Desire for Improvement Housing  ADL's:  Intact  Cognition: WNL  Sleep:  Number of Hours: 6.75     Current Medications: Current Facility-Administered Medications  Medication Dose Route Frequency Provider Last Rate Last Dose  . acetaminophen (TYLENOL) tablet 650 mg  650 mg Oral Q6H PRN Craige Cotta, MD      . alum & mag hydroxide-simeth (MAALOX/MYLANTA) 200-200-20 MG/5ML suspension 30 mL  30 mL Oral Q4H PRN Court Joy, PA-C      . baclofen (LIORESAL) tablet 10 mg  10 mg Oral BID Court Joy, PA-C   10 mg at 04/06/15 1610  . clonazePAM (KLONOPIN) tablet 0.5 mg  0.5 mg Oral TID Court Joy, PA-C   0.5 mg at 04/06/15 1210  . diclofenac sodium (VOLTAREN) 1 %  transdermal gel 4 g  4 g Topical QID Rachael Fee, MD   4 g at 04/05/15 2201  . divalproex (DEPAKOTE) DR tablet 500 mg  500 mg Oral Q12H Sanjuana Kava, NP   500 mg at 04/06/15 0808  . [START ON 04/07/2015] DULoxetine (CYMBALTA) DR capsule 60 mg  60 mg Oral Daily Rachael Fee, MD      . gabapentin (NEURONTIN) capsule 800 mg  800 mg Oral QID Court Joy, PA-C   800 mg at 04/06/15 1210  . lisinopril (PRINIVIL,ZESTRIL) tablet 20 mg  20 mg Oral Daily Court Joy, PA-C   20 mg at 04/06/15 9604   And  . hydrochlorothiazide (HYDRODIURIL) tablet 25 mg  25 mg Oral Daily Court Joy, PA-C   25 mg at 04/06/15 5409  . ibuprofen (ADVIL,MOTRIN) tablet 800 mg  800 mg Oral Q6H PRN Court Joy, PA-C      . insulin aspart (novoLOG) injection 0-15 Units  0-15 Units Subcutaneous TID WC Sanjuana Kava, NP   3 Units at 04/06/15 1210  . insulin aspart protamine- aspart (NOVOLOG MIX 70/30) injection 24 Units  24 Units Subcutaneous Q supper Court Joy, PA-C   24 Units at 04/05/15 1732  . insulin glargine (LANTUS) injection 10 Units  10 Units Subcutaneous QHS Court Joy, PA-C   10 Units at 04/05/15 2128  . levothyroxine (SYNTHROID, LEVOTHROID) tablet 100 mcg  100 mcg Oral QAC breakfast Court Joy, PA-C   100 mcg at 04/06/15 8119  . lidocaine (LIDODERM) 5 % 1 patch  1 patch Transdermal Q24H Sanjuana Kava, NP   1 patch at 04/06/15 1122  . nicotine polacrilex (NICORETTE) gum 2 mg  2 mg Oral PRN Rachael Fee, MD   2 mg at 04/06/15 0808  . pantoprazole (PROTONIX) EC tablet 40 mg  40 mg Oral Daily Rachael Fee, MD   40 mg at 04/06/15 0807  . traMADol (ULTRAM) tablet 50 mg  50 mg Oral Q6H PRN Rachael Fee, MD   50 mg at 04/06/15 1478  . traZODone (DESYREL) tablet 50 mg  50 mg Oral QHS Sanjuana Kava, NP   50 mg at 04/04/15 2123    Lab Results:  Results for orders placed or performed during the hospital encounter of 04/03/15 (from the past 48 hour(s))  Glucose, capillary     Status: Abnormal    Collection Time: 04/04/15  5:07 PM  Result Value Ref Range   Glucose-Capillary 236 (H) 65 - 99 mg/dL   Comment 1 Notify RN    Comment 2 Document in Chart   Glucose, capillary     Status: Abnormal   Collection Time: 04/04/15  8:53 PM  Result Value Ref Range  Glucose-Capillary 246 (H) 65 - 99 mg/dL  Glucose, capillary     Status: Abnormal   Collection Time: 04/05/15  6:11 AM  Result Value Ref Range   Glucose-Capillary 201 (H) 65 - 99 mg/dL  Glucose, capillary     Status: Abnormal   Collection Time: 04/05/15 11:51 AM  Result Value Ref Range   Glucose-Capillary 224 (H) 65 - 99 mg/dL  Glucose, capillary     Status: Abnormal   Collection Time: 04/05/15  5:08 PM  Result Value Ref Range   Glucose-Capillary 234 (H) 65 - 99 mg/dL  Glucose, capillary     Status: Abnormal   Collection Time: 04/05/15  9:13 PM  Result Value Ref Range   Glucose-Capillary 127 (H) 65 - 99 mg/dL   Comment 1 Notify RN   Glucose, capillary     Status: Abnormal   Collection Time: 04/06/15  6:22 AM  Result Value Ref Range   Glucose-Capillary 134 (H) 65 - 99 mg/dL   Comment 1 Notify RN   Glucose, capillary     Status: Abnormal   Collection Time: 04/06/15 12:07 PM  Result Value Ref Range   Glucose-Capillary 182 (H) 65 - 99 mg/dL    Physical Findings: AIMS: Facial and Oral Movements Muscles of Facial Expression: None, normal Lips and Perioral Area: None, normal Jaw: None, normal Tongue: None, normal,Extremity Movements Upper (arms, wrists, hands, fingers): None, normal Lower (legs, knees, ankles, toes): None, normal, Trunk Movements Neck, shoulders, hips: None, normal, Overall Severity Severity of abnormal movements (highest score from questions above): None, normal Incapacitation due to abnormal movements: None, normal Patient's awareness of abnormal movements (rate only patient's report): No Awareness, Dental Status Current problems with teeth and/or dentures?: No Does patient usually wear dentures?: No   CIWA:    COWS:     Treatment Plan Summary: Daily contact with patient to assess and evaluate symptoms and progress in treatment and Medication management Supportive approach/copings skills Depression; will continue to work with the Cymbalta and increase it to 60 mg daily The Cymbalta could help the depression and the pain Pain; will work with the Neurontin and the Tramadol Anxiety; will work with the [employer]. States she is encouraged by having been to groups without having panic attacks Will work with CBT/mindfulness  Medical Decision Making:  Review of Psycho-Social Stressors (1), Review of Medication Regimen & Side Effects (2) and Review of New Medication or Change in Dosage (2)     Kayn Haymore A 04/06/2015, 12:51 PM

## 2015-04-06 NOTE — Progress Notes (Signed)
Recreation Therapy Notes  Animal-Assisted Activity (AAA) Program Checklist/Progress Notes Patient Eligibility Criteria Checklist & Daily Group note for Rec Tx Intervention  Date: 08.30.2016 Time: 2:45pm Location: 400 [address] Dayroom    AAA/T Program Assumption of Risk Form signed by Patient/ or Parent Legal Guardian yes  Patient is free of allergies or sever asthma yes  Patient reports no fear of animals yes  Patient reports no history of cruelty to animals yes  Patient understands his/her participation is voluntary yes  Patient washes hands before animal contact yes  Patient washes hands after animal contact yes  Behavioral Response: Appropriate   Education: Hand Washing, Appropriate Animal Interaction   Education Outcome: Acknowledges education   Clinical Observations/Feedback: Patient actively engaged in session, interacting with peers and dog team appropriately.   Deetta Siegmann L Bernal Luhman, LRT/CTRS  Annell Canty L 04/06/2015 3:09 PM 

## 2015-04-06 NOTE — Clinical Social Work Note (Signed)
CSW left voicemail for brother Clarita Crane [phone] at patient's request. Awaiting return call.  Samuella Bruin, MSW, [employer] Clinical Social Worker [facility] [phone]

## 2015-04-07 DIAGNOSIS — F322 Major depressive disorder, single episode, severe without psychotic features: Secondary | ICD-10-CM | POA: Insufficient documentation

## 2015-04-07 LAB — VALPROIC ACID LEVEL: VALPROIC ACID LVL: 56 ug/mL (ref 50.0–100.0)

## 2015-04-07 LAB — GLUCOSE, CAPILLARY
GLUCOSE-CAPILLARY: 187 mg/dL — AB (ref 65–99)
GLUCOSE-CAPILLARY: 139 mg/dL — AB (ref 65–99)
GLUCOSE-CAPILLARY: 218 mg/dL — AB (ref 65–99)

## 2015-04-07 MED ORDER — LISINOPRIL-HYDROCHLOROTHIAZIDE 20-25 MG PO TABS: 1.0000 | ORAL_TABLET | Freq: Every day | ORAL | Status: DC

## 2015-04-07 MED ORDER — BACLOFEN 10 MG PO TABS: 10.0000 mg | ORAL_TABLET | Freq: Two times a day (BID) | ORAL | Status: DC

## 2015-04-07 MED ORDER — LEVOTHYROXINE SODIUM 100 MCG PO TABS: 100.0000 ug | ORAL_TABLET | Freq: Every day | ORAL | Status: DC

## 2015-04-07 MED ORDER — INSULIN ASPART 100 UNIT/ML ~~LOC~~ SOLN: 0.0000 [IU] | Freq: Three times a day (TID) | SUBCUTANEOUS | Status: DC

## 2015-04-07 MED ORDER — PANTOPRAZOLE SODIUM 40 MG PO TBEC: 40.0000 mg | DELAYED_RELEASE_TABLET | Freq: Every day | ORAL | Status: DC

## 2015-04-07 MED ORDER — INSULIN LISPRO PROT & LISPRO (75-25 MIX) 100 UNIT/ML ~~LOC~~ SUSP: 24.0000 [IU] | Freq: Every day | SUBCUTANEOUS | Status: DC

## 2015-04-07 MED ORDER — DIVALPROEX SODIUM 500 MG PO DR TAB: 500.0000 mg | DELAYED_RELEASE_TABLET | Freq: Two times a day (BID) | ORAL | Status: DC

## 2015-04-07 MED ORDER — DULOXETINE HCL 60 MG PO CPEP: 60.0000 mg | ORAL_CAPSULE | Freq: Every day | ORAL | Status: DC

## 2015-04-07 MED ORDER — VITAMIN D (ERGOCALCIFEROL) 1.25 MG (50000 UNIT) PO CAPS: 50000.0000 [IU] | ORAL_CAPSULE | ORAL | Status: DC

## 2015-04-07 MED ORDER — INSULIN GLARGINE 100 UNIT/ML ~~LOC~~ SOLN: 10.0000 [IU] | Freq: Every day | SUBCUTANEOUS | Status: DC

## 2015-04-07 MED ORDER — CLONAZEPAM 0.5 MG PO TABS: 0.5000 mg | ORAL_TABLET | Freq: Three times a day (TID) | ORAL | Status: DC

## 2015-04-07 MED ORDER — TRAZODONE HCL 50 MG PO TABS: 50.0000 mg | ORAL_TABLET | Freq: Every day | ORAL | Status: DC

## 2015-04-07 MED ORDER — GABAPENTIN 400 MG PO CAPS: 800.0000 mg | ORAL_CAPSULE | Freq: Four times a day (QID) | ORAL | Status: DC

## 2015-04-07 NOTE — Progress Notes (Signed)
D: Pt denies SI/HI/AVH. Pt is pleasant and cooperative. Pt stated she was in a bad situation and has to go back to the same thing when she leaves. Pt stated she will make a decision to stay with her significant other or leave the situation. Pt fearful of leaving due to being homeless if she leaves. Pt interacting on unit appropriately with peers and staff.   A: Pt was offered support and encouragement. Pt was given scheduled medications. Pt was encourage to attend groups. Q 15 minute checks were done for safety.   R:Pt attends groups and interacts well with peers and staff. Pt is taking medication. Pt has no complaints at this time .Pt receptive to treatment and safety maintained on unit.

## 2015-04-07 NOTE — Progress Notes (Signed)
Patient ID: Kristin Humphrey, female   DOB: November 20, 1970, 44 y.o.   MRN: [id number]   Pt discharged home with her partner. Pt was stable and appreciative at the time. All papers and prescriptions were given and valuables returned. Verbal understanding expressed. Denies SI/HI and A/VH. Pt given opportunity to express concerns and ask questions.

## 2015-04-07 NOTE — BHH Group Notes (Signed)
   [facility] LCSW Aftercare Discharge Planning Group Note  04/07/2015  8:45 AM   Participation Quality: Alert, Appropriate and Oriented  Mood/Affect: Appropriate  Depression Rating: 2  Anxiety Rating: 4  Thoughts of Suicide: Pt denies SI/HI  Will you contract for safety? Yes  Current AVH: Pt denies  Plan for Discharge/Comments: Pt attended discharge planning group and actively participated in group. CSW provided pt with today's workbook. Patient reports feeling "great" and ready to return home today. She plans to return home to follow up with her PCP for medication management.  Transportation Means: Pt reports access to transportation  Supports: No supports mentioned at this time  Samuella Bruin, MSW, [employer] Clinical Social Worker [employer] [phone]

## 2015-04-07 NOTE — Progress Notes (Signed)
  [facility] Adult Case Management Discharge Plan :  Will you be returning to the same living situation after discharge:  Yes,  Patient plans to return home At discharge, do you have transportation home?: Yes,  patient reports access to transportation Do you have the ability to pay for your medications: Yes,  patient will be provided with prescriptions at discharge  Release of information consent forms completed and in the chart;  Patient's signature needed at discharge.  Patient to Follow up at: Follow-up Information    Follow up with [facility] On 04/15/2015.   Why:  Medication management appointment with Dr. Gabriel Cirri on Thursday Sept. 8th at 1:30 pm. Please call office if you need to reschedule appointment.   Contact information:   [address] [place], [place] [address] [phone]      Follow up with [facility] .   Why:  Please call at discharge to inquire about sliding scale fees for therapy services.   Contact information:   [address], [place], [place] [address]  [phone] ext. [phone]      Patient denies SI/HI: Yes,  denies    Aeronautical engineer and Suicide Prevention discussed: Yes,  with patient  Have you used any form of tobacco in the last 30 days? (Cigarettes, Smokeless Tobacco, Cigars, and/or Pipes): Yes  Has patient been referred to the Quitline?: Patient refused referral  Veneda Kirksey, [name] 04/07/2015, 10:08 AM

## 2015-04-07 NOTE — Discharge Summary (Signed)
Physician Discharge Summary Note  Patient:  Kristin Humphrey is an 44 y.o., female MRN:  [id number] DOB:  08/10/1970 Patient phone:  [phone] (home)  Patient address:   [address] [place] [address],  Total Time spent with patient: 45 minutes  Date of Admission:  04/03/2015 Date of Discharge: 04/07/2015  Reason for Admission:  depression  Principal Problem: Severe recurrent major depression without psychotic features Discharge Diagnoses: Patient Active Problem List   Diagnosis Date Noted  . Major depressive disorder, single episode, severe without psychotic features [F32.2]   . Severe recurrent major depression without psychotic features [F33.2] 04/04/2015  . GAD (generalized anxiety disorder) [F41.1] 04/04/2015    Musculoskeletal: Strength & Muscle Tone: within normal limits Gait & Station: normal Patient leans: N/A  Psychiatric Specialty Exam: Physical Exam  Vitals reviewed.   Review of Systems  All other systems reviewed and are negative.   Blood pressure 105/78, pulse 97, temperature 98 F (36.7 C), temperature source Oral, resp. rate 18, height 5\' 4"  (1.626 m), weight 85.73 kg (189 lb), last menstrual period 04/03/2015.Body mass index is 32.43 kg/(m^2).   General Appearance: Fairly Groomed  Patent attorney:: Fair  Speech: Clear and Coherent  Volume: Normal  Mood: Euthymic  Affect: Appropriate  Thought Process: Coherent and Goal Directed  Orientation: Full (Time, Place, and Person)  Thought Content: plans as she moves on  Suicidal Thoughts: No  Homicidal Thoughts: No  Memory: Immediate; Fair Recent; Fair Remote; Fair  Judgement: Fair  Insight: Present  Psychomotor Activity: Normal  Concentration: Fair  Recall: [employer] of Knowledge:Fair  Language: Fair  Akathisia: No  Handed: Right  AIMS (if indicated):    Assets: Desire for Improvement Housing Social Support  Sleep: Number of Hours: 6.75   Cognition: WNL  ADL's: Intact       Have you used any form of tobacco in the last 30 days? (Cigarettes, Smokeless Tobacco, Cigars, and/or Pipes): Yes  Has this patient used any form of tobacco in the last 30 days? (Cigarettes, Smokeless Tobacco, Cigars, and/or Pipes) N/A  Past Medical History:  Past Medical History  Diagnosis Date  . Depression   . Anxiety   . Diabetes mellitus without complication     Past Surgical History  Procedure Laterality Date  . Neck surgery    . Bunionectomy     Family History:  Family History  Problem Relation Age of Onset  . Heart failure Mother   . Diabetes Mother   . Cancer Father   . Cancer Other   . Diabetes Other    Social History:  History  Alcohol Use No     History  Drug Use No    Social History   Social History  . Marital Status: Single    Spouse Name: N/A  . Number of Children: N/A  . Years of Education: N/A   Social History Main Topics  . Smoking status: Current Every Day Smoker -- 0.50 packs/day    Types: Cigarettes  . Smokeless tobacco: Never Used  . Alcohol Use: No  . Drug Use: No  . Sexual Activity: No   Other Topics Concern  . None   Social History Narrative   Risk to Self: Is patient at risk for suicide?: Yes What has been your use of drugs/alcohol within the last 12 months?: Denies all Risk to Others:   Prior Inpatient Therapy:   Prior Outpatient Therapy:    Level of Care:  OP  Hospital Course:  Kristin Humphrey was admitted for Severe recurrent major depression without psychotic features and crisis management.  She was treated discharged with the medications listed below under Medication List.  Medical problems were identified and treated as needed.  Home medications were restarted as appropriate.  Improvement was monitored by observation and Kristin Humphrey.  Emotional and mental status was monitored by daily self-inventory reports completed by Kristin Humphrey and  clinical Humphrey.         Kristin Humphrey was evaluated by the treatment team for stability and plans for continued recovery upon discharge.  Kristin Humphrey motivation was an integral factor for scheduling further treatment.  Employment, transportation, bed availability, health status, family support, and any pending legal issues were also considered during her hospital stay.  She was offered further treatment options upon discharge including but not limited to Residential, Intensive Outpatient, and Outpatient treatment.  Kristin Humphrey will follow up with the services as listed below under Follow Up Information.     Upon completion of this admission the patient was both mentally and medically stable for discharge denying suicidal/homicidal ideation, auditory/visual/tactile hallucinations, delusional thoughts and paranoia.      Consults:  psychiatry  Significant Diagnostic Studies:  labs: per lab  Discharge Vitals:   Blood pressure 105/78, pulse 97, temperature 98 F (36.7 C), temperature source Oral, resp. rate 18, height 5\' 4"  (1.626 m), weight 85.73 kg (189 lb), last menstrual period 04/03/2015. Body mass index is 32.43 kg/(m^2). Lab Results:   Results for orders placed or performed during the hospital encounter of 04/03/15 (from the past 72 hour(s))  Glucose, capillary     Status: Abnormal   Collection Time: 04/04/15  8:53 PM  Result Value Ref Range   Glucose-Capillary 246 (H) 65 - 99 mg/dL  Glucose, capillary     Status: Abnormal   Collection Time: 04/05/15  6:11 AM  Result Value Ref Range   Glucose-Capillary 201 (H) 65 - 99 mg/dL  Glucose, capillary     Status: Abnormal   Collection Time: 04/05/15 11:51 AM  Result Value Ref Range   Glucose-Capillary 224 (H) 65 - 99 mg/dL  Glucose, capillary     Status: Abnormal   Collection Time: 04/05/15  5:08 PM  Result Value Ref Range   Glucose-Capillary 234 (H) 65 - 99 mg/dL  Glucose, capillary     Status: Abnormal   Collection Time: 04/05/15   9:13 PM  Result Value Ref Range   Glucose-Capillary 127 (H) 65 - 99 mg/dL   Comment 1 Notify RN   Glucose, capillary     Status: Abnormal   Collection Time: 04/06/15  6:22 AM  Result Value Ref Range   Glucose-Capillary 134 (H) 65 - 99 mg/dL   Comment 1 Notify RN   Glucose, capillary     Status: Abnormal   Collection Time: 04/06/15 12:07 PM  Result Value Ref Range   Glucose-Capillary 182 (H) 65 - 99 mg/dL  Glucose, capillary     Status: Abnormal   Collection Time: 04/06/15  4:46 PM  Result Value Ref Range   Glucose-Capillary 221 (H) 65 - 99 mg/dL  Glucose, capillary     Status: Abnormal   Collection Time: 04/06/15  8:40 PM  Result Value Ref Range   Glucose-Capillary 187 (H) 65 - 99 mg/dL  Glucose, capillary     Status: Abnormal   Collection Time: 04/07/15  6:08 AM  Result Value Ref Range   Glucose-Capillary  139 (H) 65 - 99 mg/dL  Valproic acid level     Status: None   Collection Time: 04/07/15  6:34 AM  Result Value Ref Range   Valproic Acid Lvl 56 50.0 - 100.0 ug/mL    Comment: Performed at [facility]  Glucose, capillary     Status: Abnormal   Collection Time: 04/07/15 11:57 AM  Result Value Ref Range   Glucose-Capillary 218 (H) 65 - 99 mg/dL   Comment 1 Notify RN    Comment 2 Document in Chart     Physical Findings: AIMS: Facial and Oral Movements Muscles of Facial Expression: None, normal Lips and Perioral Area: None, normal Jaw: None, normal Tongue: None, normal,Extremity Movements Upper (arms, wrists, hands, fingers): None, normal Lower (legs, knees, ankles, toes): None, normal, Trunk Movements Neck, shoulders, hips: None, normal, Overall Severity Severity of abnormal movements (highest score from questions above): None, normal Incapacitation due to abnormal movements: None, normal Patient's awareness of abnormal movements (rate only patient's report): No Awareness, Dental Status Current problems with teeth and/or dentures?: No Does patient  usually wear dentures?: No  CIWA:    COWS:      See Psychiatric Specialty Exam and Suicide Risk Assessment completed by Attending Physician prior to discharge.  Discharge destination:  Home  Is patient on multiple antipsychotic therapies at discharge:  No   Has Patient had three or more failed trials of antipsychotic monotherapy by history:  No    Recommended Plan for Multiple Antipsychotic Therapies: NA     Medication List    STOP taking these medications        acetaminophen 500 MG tablet  Commonly known as:  TYLENOL     gabapentin 800 MG tablet  Commonly known as:  NEURONTIN  Replaced by:  gabapentin 400 MG capsule     ibuprofen 200 MG tablet  Commonly known as:  ADVIL,MOTRIN     multivitamin with minerals Tabs tablet      TAKE these medications      Indication   baclofen 10 MG tablet  Commonly known as:  LIORESAL  Take 1 tablet (10 mg total) by mouth 2 (two) times daily.   Indication:  Muscle Spasticity     clonazePAM 0.5 MG tablet  Commonly known as:  KLONOPIN  Take 1 tablet (0.5 mg total) by mouth 3 (three) times daily.   Indication:  Panic Disorder     divalproex 500 MG DR tablet  Commonly known as:  DEPAKOTE  Take 1 tablet (500 mg total) by mouth every 12 (twelve) hours.   Indication:  Mood stabilization     DULoxetine 60 MG capsule  Commonly known as:  CYMBALTA  Take 1 capsule (60 mg total) by mouth daily.   Indication:  Major Depressive Disorder     gabapentin 400 MG capsule  Commonly known as:  NEURONTIN  Take 2 capsules (800 mg total) by mouth 4 (four) times daily.   Indication:  Neuropathic Pain     insulin aspart 100 UNIT/ML injection  Commonly known as:  novoLOG  Inject 0-15 Units into the skin 3 (three) times daily with meals.   Indication:  Type 2 Diabetes     insulin glargine 100 UNIT/ML injection  Commonly known as:  LANTUS  Inject 0.1 mLs (10 Units total) into the skin at bedtime.   Indication:  Type 2 Diabetes     insulin  lispro protamine-lispro (75-25) 100 UNIT/ML Susp injection  Commonly known as:  HUMALOG 75/25  MIX  Inject 24 Units into the skin daily with breakfast.   Indication:  Type 2 Diabetes     levothyroxine 100 MCG tablet  Commonly known as:  SYNTHROID, LEVOTHROID  Take 1 tablet (100 mcg total) by mouth daily before breakfast.   Indication:  Underactive Thyroid     lisinopril-hydrochlorothiazide 20-25 MG per tablet  Commonly known as:  PRINZIDE,ZESTORETIC  Take 1 tablet by mouth daily.   Indication:  High Blood Pressure     pantoprazole 40 MG tablet  Commonly known as:  PROTONIX  Take 1 tablet (40 mg total) by mouth daily.   Indication:  Gastroesophageal Reflux Disease     traZODone 50 MG tablet  Commonly known as:  DESYREL  Take 1 tablet (50 mg total) by mouth at bedtime.   Indication:  Trouble Sleeping     Vitamin D (Ergocalciferol) 50000 UNITS Caps capsule  Commonly known as:  DRISDOL  Take 1 capsule (50,000 Units total) by mouth every 7 (seven) days. Usually takes on Sunday   Indication:  Vitamin D Deficiency           Follow-up Information    Follow up with [facility] On 04/15/2015.   Why:  Medication management appointment with Dr. Gabriel Cirri on Thursday Sept. 8th at 1:30 pm. Please call office if you need to reschedule appointment.   Contact information:   [address] [place], [place] [address] [phone]      Follow up with [facility] .   Why:  Please call at discharge to inquire about sliding scale fees for therapy services.   Contact information:   [address], [place], [place] [address]  [phone] ext. [phone]      Follow-up recommendations:  Activity:  as tol Diet:  as tol  Comments:  1.  Take all your medications as prescribed.              2.  Report any adverse side effects to outpatient provider.                       3.  Patient instructed to not use alcohol or illegal drugs while on prescription medicines.            4.  In  the event of worsening symptoms, instructed patient to call 911, the crisis hotline or go to nearest emergency room for evaluation of symptoms.  Total Discharge Time: 40 min  Signed: Velna Hatchet May Agustin AGNP-BC 04/07/2015, 5:43 PM  I personally assessed the patient and formulated the plan Madie Reno A. Dub Mikes, M.D.

## 2015-04-07 NOTE — Progress Notes (Signed)
Patient ID: Kristin Humphrey, female   DOB: August 25, 1970, 44 y.o.   MRN: [id number]   Pt currently presents with an appropriate affect and pleasant behavior. Per self inventory, pt rates depression at a 2, hopelessness 1 and anxiety 2. Pt's daily goal is to "discharge" and they intend to do so by "work with team." Pt reports good sleep, a good appetite, normal energy and good concentration.  Pt provided with medications per providers orders. Pt's labs and vitals were monitored throughout the day. Pt supported emotionally and encouraged to express concerns and questions. Pt educated on medications and medication adherence.  Pt's safety ensured with 15 minute and environmental checks. Pt currently denies SI/HI and A/V hallucinations. Pt verbally agrees to seek staff if SI/HI or A/VH occurs and to consult with staff before acting on these thoughts. Pt to be discharged today. Pt verbalizes "contacting my support person" will help me with my anxiety when I go home." Will continue POC.

## 2015-04-07 NOTE — [facility] Suicide Risk Assessment (Signed)
[facility] Discharge Suicide Risk Assessment   Demographic Factors:  Caucasian  Total Time spent with patient: 30 minutes  Musculoskeletal: Strength & Muscle Tone: within normal limits Gait & Station: normal Patient leans: normal  Psychiatric Specialty Exam: Physical Exam  Review of Systems  Constitutional: Negative.   HENT: Negative.   Eyes: Negative.   Respiratory: Negative.   Cardiovascular: Negative.   Gastrointestinal: Negative.   Genitourinary: Negative.   Musculoskeletal: Positive for back pain and joint pain.  Skin: Negative.   Neurological: Negative.   Endo/Heme/Allergies: Negative.   Psychiatric/Behavioral: The patient is nervous/anxious.     Blood pressure 105/78, pulse 97, temperature 98 F (36.7 C), temperature source Oral, resp. rate 18, height 5\' 4"  (1.626 m), weight 85.73 kg (189 lb), last menstrual period 04/03/2015.Body mass index is 32.43 kg/(m^2).  General Appearance: Fairly Groomed  Patent attorney::  Fair  Speech:  Clear and Coherent409  Volume:  Normal  Mood:  Euthymic  Affect:  Appropriate  Thought Process:  Coherent and Goal Directed  Orientation:  Full (Time, Place, and Person)  Thought Content:  plans as she moves on  Suicidal Thoughts:  No  Homicidal Thoughts:  No  Memory:  Immediate;   Fair Recent;   Fair Remote;   Fair  Judgement:  Fair  Insight:  Present  Psychomotor Activity:  Normal  Concentration:  Fair  Recall:  [employer] of Knowledge:Fair  Language: Fair  Akathisia:  No  Handed:  Right  AIMS (if indicated):     Assets:  Desire for Improvement Housing Social Support  Sleep:  Number of Hours: 6.75  Cognition: WNL  ADL's:  Intact   Have you used any form of tobacco in the last 30 days? (Cigarettes, Smokeless Tobacco, Cigars, and/or Pipes): Yes  Has this patient used any form of tobacco in the last 30 days? (Cigarettes, Smokeless Tobacco, Cigars, and/or Pipes) Yes, Prescription not provided because: nicotine patches given  Mental  Status Per Nursing Assessment::   On Admission:  NA (pt denies SI)  Current Mental Status by Physician: IN full contact with reality. There are no active SI plans or intent. She states she feels much better. She is going to continue follow up on outpatient basis. She has tolerated medications well so far.   Loss Factors: Decline in physical health  Historical Factors: NA  Risk Reduction Factors:   Sense of responsibility to family, Living with another person, especially a relative and Positive social support  Continued Clinical Symptoms:  Depression:   Insomnia  Cognitive Features That Contribute To Risk:  Closed-mindedness, Polarized thinking and Thought constriction (tunnel vision)    Suicide Risk:  Minimal: No identifiable suicidal ideation.  Patients presenting with no risk factors but with morbid ruminations; may be classified as minimal risk based on the severity of the depressive symptoms  Principal Problem: Severe recurrent major depression without psychotic features Discharge Diagnoses:  Patient Active Problem List   Diagnosis Date Noted  . Severe recurrent major depression without psychotic features [F33.2] 04/04/2015  . GAD (generalized anxiety disorder) [F41.1] 04/04/2015    Follow-up Information    Follow up with [facility] On 04/15/2015.   Why:  Medication management appointment with Dr. Gabriel Cirri on Thursday Sept. 8th at 1:30 pm. Please call office if you need to reschedule appointment.   Contact information:   [address] [place], [place] [address] [phone]      Follow up with [facility] .   Why:  Please call at discharge  to inquire about sliding scale fees for therapy services.   Contact information:   [address], [address], [place], [place] [address]  [phone] ext. [phone]      Plan Of Care/Follow-up recommendations:  Activity:  as tolerated Diet:  regular Follow up as above Is patient on multiple antipsychotic therapies at  discharge:  No   Has Patient had three or more failed trials of antipsychotic monotherapy by history:  No  Recommended Plan for Multiple Antipsychotic Therapies: NA    Manuela Halbur A 04/07/2015, 2:29 PM

## 2015-07-16 ENCOUNTER — Ambulatory Visit: Payer: Self-pay | Admitting: "Endocrinology

## 2015-08-19 ENCOUNTER — Ambulatory Visit: Payer: Self-pay | Admitting: "Endocrinology

## 2016-03-04 ENCOUNTER — Emergency Department (HOSPITAL_COMMUNITY)
Admission: EM | Admit: 2016-03-04 | Discharge: 2016-03-04 | Disposition: A | Discharge status: Other Acute Inpatient Hospital | Payer: PRIVATE HEALTH INSURANCE | Attending: Emergency Medicine | Admitting: Emergency Medicine

## 2016-03-04 ENCOUNTER — Encounter (HOSPITAL_COMMUNITY): Payer: Self-pay

## 2016-03-04 DIAGNOSIS — R45851 Suicidal ideations: Secondary | ICD-10-CM | POA: Insufficient documentation

## 2016-03-04 DIAGNOSIS — E119 Type 2 diabetes mellitus without complications: Secondary | ICD-10-CM | POA: Insufficient documentation

## 2016-03-04 DIAGNOSIS — F17210 Nicotine dependence, cigarettes, uncomplicated: Secondary | ICD-10-CM | POA: Insufficient documentation

## 2016-03-04 DIAGNOSIS — Z79899 Other long term (current) drug therapy: Secondary | ICD-10-CM | POA: Insufficient documentation

## 2016-03-04 DIAGNOSIS — Z794 Long term (current) use of insulin: Secondary | ICD-10-CM | POA: Insufficient documentation

## 2016-03-04 LAB — CBC WITH DIFFERENTIAL/PLATELET
BASOS ABS: 0.1 10*3/uL (ref 0.0–0.1)
Basophils Relative: 1 %
Eosinophils Absolute: 0.4 10*3/uL (ref 0.0–0.7)
Eosinophils Relative: 3 %
HEMATOCRIT: 37.7 % (ref 36.0–46.0)
HEMOGLOBIN: 11.8 g/dL — AB (ref 12.0–15.0)
LYMPHS ABS: 3.6 10*3/uL (ref 0.7–4.0)
LYMPHS PCT: 33 %
MCH: 23.9 pg — ABNORMAL LOW (ref 26.0–34.0)
MCHC: 31.3 g/dL (ref 30.0–36.0)
MCV: 76.3 fL — AB (ref 78.0–100.0)
Monocytes Absolute: 0.5 10*3/uL (ref 0.1–1.0)
Monocytes Relative: 5 %
NEUTROS ABS: 6.4 10*3/uL (ref 1.7–7.7)
Neutrophils Relative %: 58 %
Platelets: 215 10*3/uL (ref 150–400)
RBC: 4.94 MIL/uL (ref 3.87–5.11)
RDW: 18.3 % — ABNORMAL HIGH (ref 11.5–15.5)
WBC: 10.9 10*3/uL — AB (ref 4.0–10.5)

## 2016-03-04 LAB — RAPID URINE DRUG SCREEN, HOSP PERFORMED
Amphetamines: NOT DETECTED
BARBITURATES: NOT DETECTED
Benzodiazepines: POSITIVE — AB
Cocaine: NOT DETECTED
Opiates: NOT DETECTED
TETRAHYDROCANNABINOL: NOT DETECTED

## 2016-03-04 LAB — COMPREHENSIVE METABOLIC PANEL
ALK PHOS: 77 U/L (ref 38–126)
ALT: 21 U/L (ref 14–54)
AST: 18 U/L (ref 15–41)
Albumin: 3.9 g/dL (ref 3.5–5.0)
Anion gap: 5 (ref 5–15)
BUN: 9 mg/dL (ref 6–20)
CALCIUM: 9 mg/dL (ref 8.9–10.3)
CHLORIDE: 101 mmol/L (ref 101–111)
CO2: 26 mmol/L (ref 22–32)
CREATININE: 0.55 mg/dL (ref 0.44–1.00)
GFR calc Af Amer: >60 mL/min (ref >60)
GFR calc non Af Amer: >60 mL/min (ref >60)
Glucose, Bld: 274 mg/dL — ABNORMAL HIGH (ref 65–99)
Potassium: 3.7 mmol/L (ref 3.5–5.1)
SODIUM: 132 mmol/L — AB (ref 135–145)
Total Bilirubin: 0.4 mg/dL (ref 0.3–1.2)
Total Protein: 7.2 g/dL (ref 6.5–8.1)

## 2016-03-04 LAB — ACETAMINOPHEN LEVEL: Acetaminophen (Tylenol), Serum: <10 ug/mL — ABNORMAL LOW (ref 10–30)

## 2016-03-04 LAB — SALICYLATE LEVEL

## 2016-03-04 LAB — ETHANOL

## 2016-03-04 MED ORDER — PANTOPRAZOLE SODIUM 40 MG PO TBEC: 40.0000 mg | DELAYED_RELEASE_TABLET | Freq: Every day | ORAL | Status: DC

## 2016-03-04 MED ORDER — BACLOFEN 10 MG PO TABS
10.0000 mg | ORAL_TABLET | Freq: Two times a day (BID) | ORAL | Status: DC
  Administered 2016-03-04: 10 mg via ORAL
  Filled 2016-03-04: qty 1

## 2016-03-04 MED ORDER — DIVALPROEX SODIUM 250 MG PO DR TAB
500.0000 mg | DELAYED_RELEASE_TABLET | Freq: Two times a day (BID) | ORAL | Status: DC
  Administered 2016-03-04: 500 mg via ORAL
  Filled 2016-03-04: qty 2

## 2016-03-04 MED ORDER — VITAMIN D (ERGOCALCIFEROL) 1.25 MG (50000 UNIT) PO CAPS: 50000.0000 [IU] | ORAL_CAPSULE | ORAL | Status: DC

## 2016-03-04 MED ORDER — INSULIN ASPART 100 UNIT/ML ~~LOC~~ SOLN: 0.0000 [IU] | Freq: Three times a day (TID) | SUBCUTANEOUS | Status: DC

## 2016-03-04 MED ORDER — LISINOPRIL-HYDROCHLOROTHIAZIDE 20-25 MG PO TABS: 1.0000 | ORAL_TABLET | Freq: Every day | ORAL | Status: DC

## 2016-03-04 MED ORDER — INSULIN GLARGINE 100 UNIT/ML ~~LOC~~ SOLN
10.0000 [IU] | Freq: Every day | SUBCUTANEOUS | Status: DC
  Administered 2016-03-04: 10 [IU] via SUBCUTANEOUS
  Filled 2016-03-04 (×2): qty 0.1

## 2016-03-04 MED ORDER — CLONAZEPAM 0.5 MG PO TABS
0.5000 mg | ORAL_TABLET | Freq: Three times a day (TID) | ORAL | Status: DC
  Administered 2016-03-04: 0.5 mg via ORAL
  Filled 2016-03-04: qty 1

## 2016-03-04 MED ORDER — LORAZEPAM 1 MG PO TABS
1.0000 mg | ORAL_TABLET | Freq: Once | ORAL | Status: AC
  Administered 2016-03-04: 1 mg via ORAL
  Filled 2016-03-04: qty 1

## 2016-03-04 MED ORDER — TRAZODONE HCL 50 MG PO TABS
50.0000 mg | ORAL_TABLET | Freq: Every day | ORAL | Status: DC
  Administered 2016-03-04: 50 mg via ORAL
  Filled 2016-03-04: qty 1

## 2016-03-04 MED ORDER — GABAPENTIN 400 MG PO CAPS
800.0000 mg | ORAL_CAPSULE | Freq: Four times a day (QID) | ORAL | Status: DC
  Administered 2016-03-04: 800 mg via ORAL
  Filled 2016-03-04: qty 2

## 2016-03-04 MED ORDER — DULOXETINE HCL 30 MG PO CPEP: 60.0000 mg | ORAL_CAPSULE | Freq: Every day | ORAL | Status: DC

## 2016-03-04 MED ORDER — LEVOTHYROXINE SODIUM 50 MCG PO TABS: 100.0000 ug | ORAL_TABLET | Freq: Every day | ORAL | Status: DC

## 2016-03-04 NOTE — ED Notes (Signed)
Patient requesting pain medication. Patient states "if yall just let me go, I can make the pain stop"

## 2016-03-04 NOTE — ED Provider Notes (Signed)
[facility]-EMER[facility]Y DEPT Provider Note   CSN: [id number] Arrival date & time: 03/04/16  2009  First Provider Contact:  None       History   Chief Complaint No chief complaint on file.   HPI Kristin Humphrey is a 45 y.o. female.  HPI Pt was seen at 2030. Per pt, c/o gradual onset and worsening of persistent depression and SI for the past several days. Pt states she "prays every night that I don't wake up." States she called [facility] yesterday for SI, and was told to come to the ED. Pt states she "didn't because I thought I could just handle it myself." Pt states she came tonight for evaluation. [place] and has a plan to "walk out in front of the train."  Denies SA, no HI, no hallucinations.   Past Medical History:  Diagnosis Date  . Anxiety   . Depression   . Diabetes mellitus without complication [facility])     Patient Active Problem List   Diagnosis Date Noted  . Major depressive disorder, single episode, severe without psychotic features (HCC)   . Severe recurrent major depression without psychotic features (HCC) 04/04/2015  . GAD (generalized anxiety disorder) 04/04/2015    Past Surgical History:  Procedure Laterality Date  . ABDOMINAL HYSTERECTOMY    . BUNIONECTOMY    . NECK SURGERY         Home Medications    Prior to Admission medications   Medication Sig Start Date End Date Taking? Authorizing Provider  baclofen (LIORESAL) 10 MG tablet Take 1 tablet (10 mg total) by mouth 2 (two) times daily. 04/07/15   Adonis Brook, NP  clonazePAM (KLONOPIN) 0.5 MG tablet Take 1 tablet (0.5 mg total) by mouth 3 (three) times daily. 04/07/15   Adonis Brook, NP  divalproex (DEPAKOTE) 500 MG DR tablet Take 1 tablet (500 mg total) by mouth every 12 (twelve) hours. 04/07/15   Adonis Brook, NP  DULoxetine (CYMBALTA) 60 MG capsule Take 1 capsule (60 mg total) by mouth daily. 04/07/15   Adonis Brook, NP  gabapentin (NEURONTIN) 400 MG capsule Take 2 capsules  (800 mg total) by mouth 4 (four) times daily. 04/07/15   Adonis Brook, NP  insulin aspart (NOVOLOG) 100 UNIT/ML injection Inject 0-15 Units into the skin 3 (three) times daily with meals. 04/07/15   Adonis Brook, NP  insulin glargine (LANTUS) 100 UNIT/ML injection Inject 0.1 mLs (10 Units total) into the skin at bedtime. 04/07/15   Adonis Brook, NP  insulin lispro protamine-lispro (HUMALOG 75/25 MIX) (75-25) 100 UNIT/ML SUSP injection Inject 24 Units into the skin daily with breakfast. 04/07/15   Adonis Brook, NP  levothyroxine (SYNTHROID, LEVOTHROID) 100 MCG tablet Take 1 tablet (100 mcg total) by mouth daily before breakfast. 04/07/15   Adonis Brook, NP  lisinopril-hydrochlorothiazide (PRINZIDE,ZESTORETIC) 20-25 MG per tablet Take 1 tablet by mouth daily. 04/07/15   Adonis Brook, NP  pantoprazole (PROTONIX) 40 MG tablet Take 1 tablet (40 mg total) by mouth daily. 04/07/15   Adonis Brook, NP  traZODone (DESYREL) 50 MG tablet Take 1 tablet (50 mg total) by mouth at bedtime. 04/07/15   Adonis Brook, NP  Vitamin D, Ergocalciferol, (DRISDOL) 50000 UNITS CAPS capsule Take 1 capsule (50,000 Units total) by mouth every 7 (seven) days. Usually takes on Sunday 04/07/15   Adonis Brook, NP    Family History Family History  Problem Relation Age of Onset  . Heart failure Mother   . Diabetes  Mother   . Cancer Father   . Cancer Other   . Diabetes Other     Social History Social History  Substance Use Topics  . Smoking status: Current Every Day Smoker    Packs/day: 0.50    Types: Cigarettes  . Smokeless tobacco: Never Used  . Alcohol use No     Allergies   Adhesive [tape]; Celebrex [celecoxib]; and Zomig [zolmitriptan]   Review of Systems Review of Systems ROS: Statement: All systems negative except as marked or noted in the HPI; Constitutional: Negative for fever and chills. ; ; Eyes: Negative for eye pain, redness and discharge. ; ; ENMT: Negative for ear pain, hoarseness, nasal  congestion, sinus pressure and sore throat. ; ; Cardiovascular: Negative for chest pain, palpitations, diaphoresis, dyspnea and peripheral edema. ; ; Respiratory: Negative for cough, wheezing and stridor. ; ; Gastrointestinal: Negative for nausea, vomiting, diarrhea, abdominal pain, blood in stool, hematemesis, jaundice and rectal bleeding. . ; ; Genitourinary: Negative for dysuria, flank pain and hematuria. ; ; Musculoskeletal: Negative for back pain and neck pain. Negative for swelling and trauma.; ; Skin: Negative for pruritus, rash, abrasions, blisters, bruising and skin lesion.; ; Neuro: Negative for headache, lightheadedness and neck stiffness. Negative for weakness, altered level of consciousness, altered mental status, extremity weakness, paresthesias, involuntary movement, seizure and syncope.; Psych:  +SI, no SA, no HI, no hallucinations.       Physical Exam Updated Vital Signs BP (!) 145/113 (BP Location: Left Arm)   Pulse (!) 132   Temp 98.6 F (37 C) (Oral)   Ht 5\' 4"  (1.626 m)   Wt 211 lb (95.7 kg)   LMP 04/03/2015   SpO2 98%   BMI 36.22 kg/m   Physical Exam 2035: Physical examination:  Nursing notes reviewed; Vital signs and O2 SAT reviewed;  Constitutional: Well developed, Well nourished, Well hydrated, In no acute distress; Head:  Normocephalic, atraumatic; Eyes: EOMI, PERRL, No scleral icterus; ENMT: Mouth and pharynx normal, Mucous membranes moist; Neck: Supple, Full range of motion; Cardiovascular: Tachycardic rate and rhythm; Respiratory: Breath sounds clear, No wheezes.  Speaking full sentences with ease, Normal respiratory effort/excursion; Chest: No deformity, Movement normal; Abdomen: Nondistended; Extremities: No deformity.; Neuro: AA&Ox3, Major CN grossly intact.  Speech clear. No gross focal motor deficits in extremities. Climbs on and off stretcher easily by herself. Gait steady with her cane.; Skin: Color normal, Warm, Dry.; Psych:  Tearful, endorses SI.    ED  Treatments / Results  Labs (all labs ordered are listed, but only abnormal results are displayed)   EKG  EKG Interpretation  Date/Time:  Saturday March 04 2016 20:43:16 EDT Ventricular Rate:  114 PR Interval:    QRS Duration: 84 QT Interval:  343 QTC Calculation: 473 R Axis:   113 Text Interpretation:  Right and left arm electrode reversal, interpretation assumes no reversal Sinus or ectopic atrial tachycardia Probable lateral infarct, age indeterminate Baseline wander When compared with ECG of 04/03/2015 QT has shortened Otherwise no significant change Confirmed by [facility]  MD, Nicholos Johns [phone]) on 03/04/2016 8:50:17 PM       Radiology No results found.  Procedures Procedures (including critical care time)  Medications Ordered in ED Medications  LORazepam (ATIVAN) tablet 1 mg (not administered)     Initial Impression / Assessment and Plan / ED Course  I have reviewed the triage vital signs and the nursing notes.  Pertinent labs & imaging results that were available during my care of the patient were reviewed  by me and considered in my medical decision making (see chart for details).  MDM Reviewed: previous chart, nursing note and vitals Reviewed previous: labs and ECG Interpretation: labs and ECG   Results for orders placed or performed during the hospital encounter of 03/04/16  Comprehensive metabolic panel  Result Value Ref Range   Sodium 132 (L) 135 - 145 mmol/L   Potassium 3.7 3.5 - 5.1 mmol/L   Chloride 101 101 - 111 mmol/L   CO2 26 22 - 32 mmol/L   Glucose, Bld 274 (H) 65 - 99 mg/dL   BUN 9 6 - 20 mg/dL   Creatinine, Ser 0.93 0.44 - 1.00 mg/dL   Calcium 9.0 8.9 - 23.5 mg/dL   Total Protein 7.2 6.5 - 8.1 g/dL   Albumin 3.9 3.5 - 5.0 g/dL   AST 18 15 - 41 U/L   ALT 21 14 - 54 U/L   Alkaline Phosphatase 77 38 - 126 U/L   Total Bilirubin 0.4 0.3 - 1.2 mg/dL   GFR calc non Af Amer >60 >60 mL/min   GFR calc Af Amer >60 >60 mL/min   Anion gap 5 5 - 15  Urine  rapid drug screen (hosp performed)not at [facility]  Result Value Ref Range   Opiates NONE DETECTED NONE DETECTED   Cocaine NONE DETECTED NONE DETECTED   Benzodiazepines POSITIVE (A) NONE DETECTED   Amphetamines NONE DETECTED NONE DETECTED   Tetrahydrocannabinol NONE DETECTED NONE DETECTED   Barbiturates NONE DETECTED NONE DETECTED  Salicylate level  Result Value Ref Range   Salicylate Lvl <4.0 2.8 - 30.0 mg/dL  Acetaminophen level  Result Value Ref Range   Acetaminophen (Tylenol), Serum <10 (L) 10 - 30 ug/mL  Ethanol  Result Value Ref Range   Alcohol, Ethyl (B) <5 <5 mg/dL    5732:  TTS has evaluated pt: recommends inpt treatment. Placement pending.    Final Clinical Impressions(s) / ED Diagnoses   Final diagnoses:  None    New Prescriptions New Prescriptions   No medications on file     Samuel Jester, DO 03/04/16 2219

## 2016-03-04 NOTE — ED Triage Notes (Signed)
I pray every night that I do not wake up.

## 2016-03-04 NOTE — BH Assessment (Signed)
Assessment completed. Consulted Maryjean Morn, PA-C who agrees that pt meets inpatient criteria. Informed Dr. Clarene Duke of the recommendation

## 2016-03-04 NOTE — ED Notes (Signed)
Patient changed into paper scrubs and wanded by security at this time. Patient has two bags secured in locker, and money secured with security at this time.

## 2016-03-04 NOTE — ED Notes (Addendum)
Patient states she has chronic back pain X2 years, and tonight said she is tired of hurting and cant get her pain managed. Patient does not have a pain management doctor, and has not followed up with surgeon for pain management. Patient also states she has not had pain management by PCP. Patient states she thinks about walking out in front of a train to kill herself.

## 2016-03-04 NOTE — ED Notes (Signed)
Patient discharged to [facility]
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
## 2016-03-04 NOTE — BH Assessment (Signed)
Per Rosey Bath, [facility] Pt has been accepted to [facility] Room 402 bed 1 to attending Dr. Jama Flavors. Informed Dr. Juleen China of the disposition.

## 2016-03-04 NOTE — ED Triage Notes (Signed)
I called behavioral health, and they told me to come up here right away.  I was thinking about suicide.  I have been in chronic pain for two years with my back and no one will treat chronic pain any more so I am screwed.  I [place] in front of the train tracks [facility]) and there is one that comes through at 11 pm and I live in there curve and could walk out in front of it before they could ever stop.

## 2016-03-05 ENCOUNTER — Inpatient Hospital Stay (HOSPITAL_COMMUNITY)
Admission: AD | Admit: 2016-03-05 | Discharge: 2016-03-08 | DRG: 885 | Disposition: A | Discharge status: Home / Self Care | Payer: Federal, State, Local not specified - Other | Source: Intra-hospital | Attending: Psychiatry | Admitting: Psychiatry

## 2016-03-05 ENCOUNTER — Encounter (HOSPITAL_COMMUNITY): Payer: Self-pay

## 2016-03-05 DIAGNOSIS — E559 Vitamin D deficiency, unspecified: Secondary | ICD-10-CM | POA: Diagnosis present

## 2016-03-05 DIAGNOSIS — K5900 Constipation, unspecified: Secondary | ICD-10-CM | POA: Diagnosis present

## 2016-03-05 DIAGNOSIS — M549 Dorsalgia, unspecified: Secondary | ICD-10-CM | POA: Diagnosis present

## 2016-03-05 DIAGNOSIS — Z833 Family history of diabetes mellitus: Secondary | ICD-10-CM

## 2016-03-05 DIAGNOSIS — G4700 Insomnia, unspecified: Secondary | ICD-10-CM | POA: Diagnosis present

## 2016-03-05 DIAGNOSIS — F17210 Nicotine dependence, cigarettes, uncomplicated: Secondary | ICD-10-CM | POA: Diagnosis present

## 2016-03-05 DIAGNOSIS — F3130 Bipolar disorder, current episode depressed, mild or moderate severity, unspecified: Secondary | ICD-10-CM | POA: Diagnosis not present

## 2016-03-05 DIAGNOSIS — R45851 Suicidal ideations: Secondary | ICD-10-CM | POA: Diagnosis not present

## 2016-03-05 DIAGNOSIS — Z8249 Family history of ischemic heart disease and other diseases of the circulatory system: Secondary | ICD-10-CM

## 2016-03-05 DIAGNOSIS — F411 Generalized anxiety disorder: Secondary | ICD-10-CM | POA: Diagnosis present

## 2016-03-05 DIAGNOSIS — G8929 Other chronic pain: Secondary | ICD-10-CM | POA: Diagnosis present

## 2016-03-05 DIAGNOSIS — F4001 Agoraphobia with panic disorder: Secondary | ICD-10-CM | POA: Diagnosis present

## 2016-03-05 DIAGNOSIS — F332 Major depressive disorder, recurrent severe without psychotic features: Principal | ICD-10-CM | POA: Diagnosis present

## 2016-03-05 DIAGNOSIS — Z79899 Other long term (current) drug therapy: Secondary | ICD-10-CM

## 2016-03-05 DIAGNOSIS — Z818 Family history of other mental and behavioral disorders: Secondary | ICD-10-CM | POA: Diagnosis not present

## 2016-03-05 DIAGNOSIS — E119 Type 2 diabetes mellitus without complications: Secondary | ICD-10-CM | POA: Diagnosis present

## 2016-03-05 DIAGNOSIS — K219 Gastro-esophageal reflux disease without esophagitis: Secondary | ICD-10-CM | POA: Diagnosis present

## 2016-03-05 DIAGNOSIS — Z794 Long term (current) use of insulin: Secondary | ICD-10-CM

## 2016-03-05 DIAGNOSIS — Z915 Personal history of self-harm: Secondary | ICD-10-CM

## 2016-03-05 LAB — GLUCOSE, CAPILLARY
GLUCOSE-CAPILLARY: 275 mg/dL — AB (ref 65–99)
GLUCOSE-CAPILLARY: 206 mg/dL — AB (ref 65–99)
Glucose-Capillary: 290 mg/dL — ABNORMAL HIGH (ref 65–99)
Glucose-Capillary: 214 mg/dL — ABNORMAL HIGH (ref 65–99)
Glucose-Capillary: 263 mg/dL — ABNORMAL HIGH (ref 65–99)

## 2016-03-05 MED ORDER — INSULIN ASPART 100 UNIT/ML ~~LOC~~ SOLN
0.0000 [IU] | Freq: Three times a day (TID) | SUBCUTANEOUS | Status: DC
  Administered 2016-03-05 – 2016-03-06 (×3): 4 [IU] via SUBCUTANEOUS
  Administered 2016-03-06 (×2): 2 [IU] via SUBCUTANEOUS
  Administered 2016-03-07: 7 [IU] via SUBCUTANEOUS
  Administered 2016-03-07: 2 [IU] via SUBCUTANEOUS
  Administered 2016-03-07 – 2016-03-08 (×2): 4 [IU] via SUBCUTANEOUS
  Administered 2016-03-08: 2 [IU] via SUBCUTANEOUS

## 2016-03-05 MED ORDER — LISINOPRIL 20 MG PO TABS
20.0000 mg | ORAL_TABLET | Freq: Every day | ORAL | Status: DC
  Administered 2016-03-05 – 2016-03-08 (×4): 20 mg via ORAL
  Filled 2016-03-05 (×6): qty 1

## 2016-03-05 MED ORDER — INSULIN GLARGINE 100 UNIT/ML ~~LOC~~ SOLN
10.0000 [IU] | Freq: Every day | SUBCUTANEOUS | Status: DC
  Administered 2016-03-05 – 2016-03-07 (×3): 10 [IU] via SUBCUTANEOUS

## 2016-03-05 MED ORDER — INSULIN ASPART 100 UNIT/ML ~~LOC~~ SOLN
7.0000 [IU] | Freq: Once | SUBCUTANEOUS | Status: AC
  Administered 2016-03-05: 7 [IU] via SUBCUTANEOUS

## 2016-03-05 MED ORDER — DIVALPROEX SODIUM 500 MG PO DR TAB
500.0000 mg | DELAYED_RELEASE_TABLET | Freq: Two times a day (BID) | ORAL | Status: DC
  Administered 2016-03-05 – 2016-03-08 (×7): 500 mg via ORAL
  Filled 2016-03-05 (×12): qty 1

## 2016-03-05 MED ORDER — GABAPENTIN 400 MG PO CAPS
800.0000 mg | ORAL_CAPSULE | Freq: Four times a day (QID) | ORAL | Status: DC
  Administered 2016-03-05 – 2016-03-08 (×15): 800 mg via ORAL
  Filled 2016-03-05 (×21): qty 2

## 2016-03-05 MED ORDER — BACLOFEN 10 MG PO TABS
10.0000 mg | ORAL_TABLET | Freq: Two times a day (BID) | ORAL | Status: DC
  Administered 2016-03-05 – 2016-03-08 (×8): 10 mg via ORAL
  Filled 2016-03-05 (×11): qty 1

## 2016-03-05 MED ORDER — PANTOPRAZOLE SODIUM 40 MG PO TBEC
40.0000 mg | DELAYED_RELEASE_TABLET | Freq: Every day | ORAL | Status: DC
  Administered 2016-03-05 – 2016-03-08 (×5): 40 mg via ORAL
  Filled 2016-03-05 (×7): qty 1

## 2016-03-05 MED ORDER — HYDROCHLOROTHIAZIDE 25 MG PO TABS
25.0000 mg | ORAL_TABLET | Freq: Every day | ORAL | Status: DC
  Administered 2016-03-05 – 2016-03-08 (×4): 25 mg via ORAL
  Filled 2016-03-05 (×6): qty 1

## 2016-03-05 MED ORDER — VITAMIN D (ERGOCALCIFEROL) 1.25 MG (50000 UNIT) PO CAPS
50000.0000 [IU] | ORAL_CAPSULE | ORAL | Status: DC
  Administered 2016-03-05: 50000 [IU] via ORAL
  Filled 2016-03-05 (×2): qty 1

## 2016-03-05 MED ORDER — MAGNESIUM HYDROXIDE 400 MG/5ML PO SUSP: 30.0000 mL | Freq: Every day | ORAL | Status: DC | PRN

## 2016-03-05 MED ORDER — DULOXETINE HCL 60 MG PO CPEP
60.0000 mg | ORAL_CAPSULE | Freq: Every day | ORAL | Status: DC
  Administered 2016-03-05 – 2016-03-08 (×4): 60 mg via ORAL
  Filled 2016-03-05 (×6): qty 1

## 2016-03-05 MED ORDER — ACETAMINOPHEN 325 MG PO TABS
650.0000 mg | ORAL_TABLET | Freq: Four times a day (QID) | ORAL | Status: DC | PRN
Start: 2016-03-05 — End: 2016-03-09
  Administered 2016-03-05 – 2016-03-08 (×6): 650 mg via ORAL
  Filled 2016-03-05 (×6): qty 2

## 2016-03-05 MED ORDER — PNEUMOCOCCAL VAC POLYVALENT 25 MCG/0.5ML IJ INJ
0.5000 mL | INJECTION | INTRAMUSCULAR | Status: AC
  Administered 2016-03-06: 0.5 mL via INTRAMUSCULAR

## 2016-03-05 MED ORDER — NICOTINE 21 MG/24HR TD PT24
21.0000 mg | MEDICATED_PATCH | Freq: Every day | TRANSDERMAL | Status: DC
  Administered 2016-03-05 – 2016-03-08 (×4): 21 mg via TRANSDERMAL
  Filled 2016-03-05 (×6): qty 1

## 2016-03-05 MED ORDER — CLONAZEPAM 0.5 MG PO TABS
0.5000 mg | ORAL_TABLET | Freq: Three times a day (TID) | ORAL | Status: DC
  Administered 2016-03-05 – 2016-03-08 (×12): 0.5 mg via ORAL
  Filled 2016-03-05 (×12): qty 1

## 2016-03-05 MED ORDER — LIDOCAINE 5 % EX PTCH
1.0000 | MEDICATED_PATCH | CUTANEOUS | Status: DC
  Administered 2016-03-05 – 2016-03-08 (×4): 1 via TRANSDERMAL
  Filled 2016-03-05 (×8): qty 1

## 2016-03-05 MED ORDER — LEVOTHYROXINE SODIUM 100 MCG PO TABS
100.0000 ug | ORAL_TABLET | Freq: Every day | ORAL | Status: DC
  Administered 2016-03-05 – 2016-03-08 (×4): 100 ug via ORAL
  Filled 2016-03-05 (×8): qty 1

## 2016-03-05 MED ORDER — ALUM & MAG HYDROXIDE-SIMETH 200-200-20 MG/5ML PO SUSP: 30.0000 mL | ORAL | Status: DC | PRN

## 2016-03-05 MED ORDER — TRAZODONE HCL 50 MG PO TABS
50.0000 mg | ORAL_TABLET | Freq: Every day | ORAL | Status: DC
  Administered 2016-03-05 – 2016-03-07 (×3): 50 mg via ORAL
  Filled 2016-03-05 (×5): qty 1

## 2016-03-05 NOTE — Progress Notes (Signed)
Adult Psychoeducational Group Note  Date:  03/05/2016 Time:0900 am  Group Topic/Focus:  Healthy Support Systems  Participation Level:  Did Not Attend  Participation Quality:    Affect:    Cognitive:    Insight:   Engagement in Group:    Modes of Intervention:    Additional Comments:   Shelisa Fern L 03/05/2016, 11:10 AM
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
## 2016-03-05 NOTE — Progress Notes (Signed)
Adult Psychoeducational Group Note  Date:  03/05/2016 Time:  10:34 PM  Group Topic/Focus:  Wrap-Up Group:   The focus of this group is to help patients review their daily goal of treatment and discuss progress on daily workbooks.   Participation Level:  Active  Participation Quality:  Appropriate  Affect:  Flat  Cognitive:  Alert and Oriented  Insight: Lacking  Engagement in Group:  Lacking and Limited  Modes of Intervention:  Discussion  Additional Comments:  Patient attended group, but did not wish to share.  Korrine Sicard W Raudel Bazen 03/05/2016, 10:34 PM

## 2016-03-05 NOTE — BHH Suicide Risk Assessment (Signed)
[facility] Admission Suicide Risk Assessment   Nursing information obtained from:  Patient Demographic factors:  Caucasian, Low socioeconomic status, Unemployed Current Mental Status:  Suicidal ideation indicated by patient, Suicide plan Loss Factors:  Decline in physical health, Financial problems / change in socioeconomic status, Loss of significant relationship Historical Factors:  Prior suicide attempts, Family history of mental illness or substance abuse Risk Reduction Factors:  NA  Total Time spent with patient: 30 minutes Principal Problem: Severe recurrent major depression without psychotic features (HCC) Diagnosis:   Patient Active Problem List   Diagnosis Date Noted  . Severe recurrent major depression without psychotic features (HCC) [F33.2] 04/04/2015    Priority: High  . Chronic back pain [M54.9, G89.29] 03/05/2016  . Suicidal ideations [R45.851] 03/05/2016  . Major depressive disorder, single episode, severe without psychotic features (HCC) [F32.2]   . GAD (generalized anxiety disorder) [F41.1] 04/04/2015   Subjective Data: Patient with history of depression and chronic pain who was admitted after she verbalized suicidal thoughts due to not being able to find resolution to her chronic back pain.  Continued Clinical Symptoms:  Alcohol Use Disorder Identification Test Final Score (AUDIT): 0 The "Alcohol Use Disorders Identification Test", Guidelines for Use in Primary Care, Second Edition.  World Science writer [facility]). Score between 0-7:  no or low risk or alcohol related problems. Score between 8-15:  moderate risk of alcohol related problems. Score between 16-19:  high risk of alcohol related problems. Score 20 or above:  warrants further diagnostic evaluation for alcohol dependence and treatment.   CLINICAL FACTORS:   Depression:   Anhedonia Hopelessness Insomnia Severe Chronic Pain   Musculoskeletal: Strength & Muscle Tone: within normal limits Gait & Station:  unsteady Patient leans: Front  Psychiatric Specialty Exam: Physical Exam  Psychiatric: Her speech is normal. Judgment normal. She is slowed and withdrawn. Cognition and memory are normal. She exhibits a depressed mood. She expresses suicidal ideation.    Review of Systems  Constitutional: Negative.   HENT: Negative.   Gastrointestinal: Negative.   Genitourinary: Negative.   Musculoskeletal: Positive for back pain and joint pain.  Skin: Negative.   Neurological: Negative.   Endo/Heme/Allergies: Negative.   Psychiatric/Behavioral: Positive for depression and suicidal ideas. The patient has insomnia.     Blood pressure (!) 135/96, pulse 70, temperature 98.4 F (36.9 C), temperature source Oral, resp. rate 20, height  (1.626 m), weight 95.7 kg (211 lb), last menstrual period 04/03/2015.Body mass index is 36.22 kg/m.  General Appearance: Casual  Eye Contact:  Good  Speech:  Clear and Coherent  Volume:  Decreased  Mood:  Dysphoric and Hopeless  Affect:  Constricted  Thought Process:  Coherent and Descriptions of Associations: Intact  Orientation:  Full (Time, Place, and Person)  Thought Content:  Logical  Suicidal Thoughts:  Yes.  without intent/plan  Homicidal Thoughts:  No  Memory:  Immediate;   Good Recent;   Good Remote;   Good  Judgement:  Fair  Insight:  Shallow  Psychomotor Activity:  Psychomotor Retardation  Concentration:  Concentration: Fair and Attention Span: Fair  Recall:  Good  Fund of Knowledge:  Good  Language:  Good  Akathisia:  No  Handed:  Right  AIMS (if indicated):     Assets:  Communication Skills Desire for Improvement  ADL's:  Intact  Cognition:  WNL  Sleep:   poor      COGNITIVE FEATURES THAT CONTRIBUTE TO RISK:  Polarized thinking    SUICIDE RISK:   Mild:  Suicidal ideation of limited frequency, intensity, duration, and specificity.  There are no identifiable plans, no associated intent, mild dysphoria and related symptoms, good  self-control (both objective and subjective assessment), few other risk factors, and identifiable protective factors, including available and accessible social support.   PLAN OF CARE: 1. Admit for crisis management and stabilization. 2. Medication management to reduce current symptoms to base line and improve the     patient's overall level of functioning 3. Treat health problems as indicated. 4. Develop treatment plan to decrease risk of relapse upon discharge and the need for     readmission. 5. Psycho-social education regarding relapse prevention and self care. 6. Health care follow up as needed for medical problems. 7. Restart home medications where appropriate.   I certify that inpatient services furnished can reasonably be expected to improve the patient's condition.  Thedore Mins, MD 03/05/2016, 12:54 PM

## 2016-03-05 NOTE — Progress Notes (Signed)
Pt would like to discuss meds with MD. Pt states that she takes 45u of 70/30 daily in the morning.

## 2016-03-05 NOTE — Progress Notes (Signed)
Kristin Humphrey is a 45 year old female from [employer], admitted voluntarily. She was SI with a plan to walk in front of a train that goes by her house daily. She has suffered from chronic back pain for 2 years. She reports that her pain is the reason she is suicidal. She reports that her Aunt died on 12-28-22. She has had previous attempts. She currently denies si/hi/a/v hallucinations.Medical hx include IDDM, depression and anxiety.  She was oriented to unit and safety maintained on unit with 15 min checks.

## 2016-03-05 NOTE — Tx Team (Signed)
Initial Interdisciplinary Treatment Plan   PATIENT STRESSORS: Financial difficulties Health problems Loss of Aunt on Monday   PATIENT STRENGTHS: Ability for insight Average or above average intelligence Motivation for treatment/growth   PROBLEM LIST: Problem List/Patient Goals Date to be addressed Date deferred Reason deferred Estimated date of resolution  Depression 03/05/2016     SI 03/05/2016     Anxiety 03/05/2016           "I want to learn how to deal with this pain."                               DISCHARGE CRITERIA:  Improved stabilization in mood, thinking, and/or behavior  PRELIMINARY DISCHARGE PLAN: Return to previous living arrangement  PATIENT/FAMIILY INVOLVEMENT: This treatment plan has been presented to and reviewed with the patient, [name], and/or family member.  The patient and family have been given the opportunity to ask questions and make suggestions.  Floyce Stakes 03/05/2016, 4:12 AM
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
## 2016-03-05 NOTE — Progress Notes (Signed)
Adult Psychoeducational Group Note  Date:  03/05/2016 Time: 1100 am Health support systems/life skills  Participation Level:  Did Not Attend  Participation Quality:    Affect:    Cognitive:    Insight:   Engagement in Group:    Modes of Intervention:    Additional Comments:   Kristin Humphrey L 03/05/2016, 2:40 PM

## 2016-03-05 NOTE — Progress Notes (Signed)
D: Pt presents with flat affect and depressed mood. Pt rates depression 9/10. Hopelessness 9/10. Anxiety 10/10. Pt endorses passive suicidal thoughts, with no active suicidal thoughts this morning or noon time. Pt verbally contracts for safety. Pt reports feeling depressed due to chronic pain. Pt ambulates with a walker due to unsteady gait. Pt reports chronic pain to her lower back and right leg. Pt isolates in her room due to intense pain. Pt expressed that she talked to the doctor about pain meds and is not happy with the results. Pt signed a 72 hour request for discharge form this morning at 10:32 am.  A: Medications reviewed with pt. Mediations administered as ordered per MD. Verbal support provided. Pt encouraged to attend groups.  R: Pt verbalizes understanding of med regimen.

## 2016-03-06 DIAGNOSIS — F3130 Bipolar disorder, current episode depressed, mild or moderate severity, unspecified: Secondary | ICD-10-CM

## 2016-03-06 LAB — GLUCOSE, CAPILLARY
GLUCOSE-CAPILLARY: 194 mg/dL — AB (ref 65–99)
GLUCOSE-CAPILLARY: 222 mg/dL — AB (ref 65–99)
Glucose-Capillary: 193 mg/dL — ABNORMAL HIGH (ref 65–99)
Glucose-Capillary: 221 mg/dL — ABNORMAL HIGH (ref 65–99)

## 2016-03-06 LAB — LIPID PANEL
CHOL/HDL RATIO: 5.5 ratio
CHOLESTEROL: 281 mg/dL — AB (ref 0–200)
HDL: 51 mg/dL (ref >40)
LDL Cholesterol: 181 mg/dL — ABNORMAL HIGH (ref 0–99)
TRIGLYCERIDES: 244 mg/dL — AB (ref <150)
VLDL: 49 mg/dL — ABNORMAL HIGH (ref 0–40)

## 2016-03-06 LAB — TSH: TSH: 1.448 u[IU]/mL (ref 0.350–4.500)

## 2016-03-06 LAB — VALPROIC ACID LEVEL: VALPROIC ACID LVL: 48 ug/mL — AB (ref 50.0–100.0)

## 2016-03-06 MED ORDER — IBUPROFEN 400 MG PO TABS
400.0000 mg | ORAL_TABLET | Freq: Four times a day (QID) | ORAL | Status: DC | PRN
  Administered 2016-03-06 – 2016-03-07 (×2): 400 mg via ORAL
  Filled 2016-03-06 (×2): qty 1
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
## 2016-03-06 NOTE — [facility] Suicide Risk Assessment (Signed)
[facility] Admission Suicide Risk Assessment   Nursing information obtained from:  Patient Demographic factors:  Caucasian, Low socioeconomic status, Unemployed Current Mental Status:  Suicidal ideation indicated by patient, Suicide plan Loss Factors:  Decline in physical health, Financial problems / change in socioeconomic status, Loss of significant relationship Historical Factors:  Prior suicide attempts, Family history of mental illness or substance abuse Risk Reduction Factors:  NA  Total Time spent with patient: 45 minutes Principal Problem: Severe recurrent major depression without psychotic features (HCC) Diagnosis:   Patient Active Problem List   Diagnosis Date Noted  . Chronic back pain [M54.9, G89.29] 03/05/2016  . Suicidal ideations [R45.851] 03/05/2016  . Major depressive disorder, single episode, severe without psychotic features (HCC) [F32.2]   . Severe recurrent major depression without psychotic features (HCC) [F33.2] 04/04/2015  . GAD (generalized anxiety disorder) [F41.1] 04/04/2015     Continued Clinical Symptoms:  Alcohol Use Disorder Identification Test Final Score (AUDIT): 0 The "Alcohol Use Disorders Identification Test", Guidelines for Use in Primary Care, Second Edition.  World Science writer [facility]). Score between 0-7:  no or low risk or alcohol related problems. Score between 8-15:  moderate risk of alcohol related problems. Score between 16-19:  high risk of alcohol related problems. Score 20 or above:  warrants further diagnostic evaluation for alcohol dependence and treatment.   CLINICAL FACTORS:  45 year old female , reports worsening depression, related to chronic pain and to death of father. Has been diagnosed with Bipolar Disorder, and reports episodes of hypomania in the past . At this time no manic symptoms noted or reported       Psychiatric Specialty Exam: Physical Exam  ROS  Blood pressure 104/74, pulse 99, temperature 97.3 F (36.3 C),  temperature source Oral, resp. rate 18, height 5\' 4"  (1.626 m), weight 211 lb (95.7 kg), last menstrual period 04/03/2015.Body mass index is 36.22 kg/m.   see admit note MSE    COGNITIVE FEATURES THAT CONTRIBUTE TO RISK:  Closed-mindedness and Loss of executive function    SUICIDE RISK:   Moderate:  Frequent suicidal ideation with limited intensity, and duration, some specificity in terms of plans, no associated intent, good self-control, limited dysphoria/symptomatology, some risk factors present, and identifiable protective factors, including available and accessible social support.   PLAN OF CARE: Patient will be admitted to inpatient psychiatric unit for stabilization and safety. Will provide and encourage milieu participation. Provide medication management and maked adjustments as needed.  Will follow daily.    I certify that inpatient services furnished can reasonably be expected to improve the patient's condition.  Nehemiah Massed, MD 03/06/2016, 3:26 PM

## 2016-03-06 NOTE — Progress Notes (Signed)
Adult Psychoeducational Group Note  Date:  03/06/2016 Time:  11:56 PM  Group Topic/Focus:  Wrap-Up Group:   The focus of this group is to help patients review their daily goal of treatment and discuss progress on daily workbooks.   Participation Level:  Active  Participation Quality:  Appropriate and Attentive  Affect:  Appropriate  Cognitive:  Alert and Appropriate  Insight: Appropriate  Engagement in Group:  Engaged  Modes of Intervention:  Activity, Discussion and Education  Additional Comments:  Pt reported her day was a 4 due to her back pain.  She stated that she was not receiving medications. Pt has been observed lying in the floor on her tummy to relieve her pain.  Pt presented as pleasant and jovial. Gwyndolyn Kaufman 03/06/2016, 11:56 PM

## 2016-03-06 NOTE — Tx Team (Signed)
Interdisciplinary Treatment Plan Update (Adult) Date: 03/06/2016    Time Reviewed: 9:30 AM  Progress in Treatment: Attending groups: Continuing to assess, patient new to milieu Participating in groups: Continuing to assess, patient new to milieu Taking medication as prescribed: Yes Tolerating medication: Yes Family/Significant other contact made: No, CSW assessing for appropriate contacts Patient understands diagnosis: Yes Discussing patient identified problems/goals with staff: Yes Medical problems stabilized or resolved: Yes Denies suicidal/homicidal ideation: Yes Issues/concerns per patient self-inventory: Yes Other:  New problem(s) identified: N/A  Discharge Plan or Barriers: Home with outpatient services.   Reason for Continuation of Hospitalization:  Depression Anxiety Medication Stabilization   Comments: N/A  Estimated length of stay: 1-2 days   Patient is a 45 year old female who presented to the hospital with Major Depressive Disorder and SI. Primary triggers for admission include chronic pain issues. Patient will benefit from crisis stabilization, medication evaluation, group therapy and psycho education in addition to case management for discharge planning. At discharge, it is recommended that Pt remain compliant with established discharge plan and continued treatment.   Review of initial/current patient goals per problem list:  1. Goal(s): Patient will participate in aftercare plan   Met: Yes   Target date: 3-5 days post admission date   As evidenced by: Patient will participate within aftercare plan AEB aftercare provider and housing plan at discharge being identified.  7/31: Goal met. Patient plans to return home to follow up with outpatient services.    2. Goal (s): Patient will exhibit decreased depressive symptoms and suicidal ideations.   Met: No   Target date: 3-5 days post admission date   As evidenced by: Patient will utilize self  rating of depression at 3 or below and demonstrate decreased signs of depression or be deemed stable for discharge by MD.  7/30: Patient rates depression 9, endorses passive SI.     3. Goal(s): Patient will demonstrate decreased signs and symptoms of anxiety.   Met:  No   Target date: 3-5 days post admission date   As evidenced by: Patient will utilize self rating of anxiety at 3 or below and demonstrated decreased signs of anxiety, or be deemed stable for discharge by MD  7/30: Goal not met: Pt presents with anxious mood and affect.  Pt admitted with high anxiety levels. Pt to show decreased sign of anxiety and a rating of 3 or less before d/c.    Attendees: Patient:    Family:    Physician: Dr. Parke Poisson; Dr. Shea Evans; Dr. Modesta Messing 03/06/2016 9:30 AM  Nursing: Grayland Ormond, Larrie Kass., RN 03/06/2016 9:30 AM  Clinical Social Worker: Erasmo Downer Trenesha Alcaide, LCSW 03/06/2016 9:30 AM  Other: Maxie Better, LCSW  03/06/2016 9:30 AM  Other: Norberto Sorenson, P4CC 03/06/2016 9:30 AM  Other:  03/06/2016 9:30 AM  Other: Agustina Caroli, May Augustin, NP 03/06/2016 9:30 AM  Other:      Scribe for Treatment Team:  Tilden Fossa, [place]

## 2016-03-06 NOTE — [facility] Group Notes (Signed)
[facility] LCSW Group Therapy 03/06/2016  1:15 pm  Type of Therapy: Group Therapy Participation Level: Minimal  Participation Quality: Attentive  Affect: Depressed and Flat  Cognitive: Alert and Oriented  Insight: Developing/Improving and Engaged  Engagement in Therapy: Developing/Improving and Engaged  Modes of Intervention: Clarification, Confrontation, Discussion, Education, Exploration,  Limit-setting, Orientation, Problem-solving, Rapport Building, Reality Testing, Socialization and Support  Summary of Progress/Problems: Pt identified obstacles faced currently and processed barriers involved in overcoming these obstacles. Pt identified steps necessary for overcoming these obstacles and explored motivation (internal and external) for facing these difficulties head on. Pt further identified one area of concern in their lives and chose a goal to focus on for today. Patient participated minimally in discussion despite CSW encouragement.   Masud Holub, LCSW Clinical Social Worker [facility] [phone]   

## 2016-03-06 NOTE — Progress Notes (Signed)
Patient stated that she was able to sleep for the past three days and stated that she felt much better, with the exception of pain.  Patient denies any feeling of SI, HI, or AVH.  Patient has been laying on floor in dayroom, stating that the physician was going to give her Ultram later on. Patient stated that her depression was at a 1/ anxiety at a 3/ and hopelessness at 1. Patient has fair affect.   Patient safety is maintained through q 15 minute checks. Education, support, and encouragement offered to patient. Medication given to patient.  Patient is receptive and compliant, will continue to monitor.

## 2016-03-06 NOTE — Progress Notes (Addendum)
Writer spoke with patient 1:1 and she reports that her day has been ok but she has still been experiencing back pain. She attended group and asked that she be able to lie on the floor because it was too painful for her to sit. She was compliant with scheduled medications and reports that her boyfriend visited her on this evening. Writer gave her ice packs for back pain relief. She signed 72 hour on day shift. Support given and safety maintained on unit with 15 min checks.

## 2016-03-06 NOTE — Progress Notes (Signed)
DAR NOTE: Patient presents with anxious affect and depressed mood.  Denies  auditory and visual hallucinations.  Rates depression at 2, hopelessness at 2, and anxiety at 4.  Described energy level as normal and concentration as poor.  Maintained on routine safety checks.  Medications given as prescribed.  Support and encouragement offered as needed.  Attended group and participated.  States goal for today is "decrease anxiety and pain."  Patient observed socializing with peers in the dayroom.  Complain of back, neck and leg pain.  Motrin 400 mg given for pain with fair effect.  Patient request for a stronger analgesic.  Reports that Motrin is not as effective.  MD made aware of request.  Cold compress applied to area as needed.   Patient ambulatory on the unit with walker.   Insulin coverage given x 2 for blood glucose level of 221 and 194.  No adverse reaction noted.

## 2016-03-06 NOTE — Progress Notes (Signed)
Recreation Therapy Notes  Date: 03/06/16 Time: 0930 Location: [address] Group Room  Group Topic: Stress Management  Goal Area(s) Addresses:  Patient will verbalize importance of using healthy stress management.  Patient will identify positive emotions associated with healthy stress management.   Behavioral Response: Engaged  Intervention: Stress Management  Activity :  Healthy Boundaries Guided Imagery.  LRT introduced the technique of guided imagery to the patients.  Pt were to follow along as the LRT read the script to engaged in the activity.    Education:  Stress Management, Discharge Planning.   Education Outcome: Acknowledges edcuation/In group clarification offered/Needs additional education  Clinical Observations/Feedback: Pt attended group.   Frisco Cordts, LRT/CTRS  

## 2016-03-07 LAB — GLUCOSE, CAPILLARY
GLUCOSE-CAPILLARY: 221 mg/dL — AB (ref 65–99)
GLUCOSE-CAPILLARY: 252 mg/dL — AB (ref 65–99)
Glucose-Capillary: 183 mg/dL — ABNORMAL HIGH (ref 65–99)

## 2016-03-07 LAB — HEMOGLOBIN A1C
Hgb A1c MFr Bld: 9.5 % — ABNORMAL HIGH (ref 4.8–5.6)
MEAN PLASMA GLUCOSE: 226 mg/dL

## 2016-03-07 LAB — PROLACTIN: PROLACTIN: 22.9 ng/mL (ref 4.8–23.3)

## 2016-03-07 MED ORDER — IBUPROFEN 800 MG PO TABS
800.0000 mg | ORAL_TABLET | Freq: Three times a day (TID) | ORAL | Status: DC | PRN
  Administered 2016-03-07 – 2016-03-08 (×4): 800 mg via ORAL
  Filled 2016-03-07 (×4): qty 1

## 2016-03-07 NOTE — [facility] Group Notes (Signed)

## 2016-03-07 NOTE — Progress Notes (Signed)
Recreation Therapy Notes  Animal-Assisted Activity (AAA) Program Checklist/Progress Notes Patient Eligibility Criteria Checklist & Daily Group note for Rec TxIntervention  Date: 08.01.2017 Time: 2:45pm Location: [address]    AAA/T Program Assumption of Risk Form signed by Patient/ or Parent Legal Guardian Yes  Patient is free of allergies or sever asthma Yes  Patient reports no fear of animals Yes  Patient reports no history of cruelty to animals Yes  Patient understands his/her participation is voluntary Yes  Patient washes hands before animal contact Yes  Patient washes hands after animal contact Yes  Behavioral Response: Engaged, Attentive   Education:Hand Washing, Appropriate Animal Interaction   Education Outcome: Acknowledges education.   Clinical Observations/Feedback: Patient attended session and interacted appropriately with therapy dog and peers. Patient asked appropriate questions about therapy dog and his training.    Marykay Lex Joice Nazario, LRT/CTRS  Joshawn Crissman L 03/07/2016 4:01 PM

## 2016-03-07 NOTE — Progress Notes (Signed)
The focus of this group is to help patients review their daily goal of treatment and discuss progress on daily workbooks.  Patient attended group this evening. Patient states that her goal of the day was to get to talk to her social worker but she didn't  Get to talk to the doctor. Patient states that her day was good overall.

## 2016-03-07 NOTE — Progress Notes (Signed)
Inpatient Diabetes Program Recommendations  AACE/ADA: New Consensus Statement on Inpatient Glycemic Control (2015)  Target Ranges:  Prepandial:   less than 140 mg/dL      Peak postprandial:   less than 180 mg/dL (1-2 hours)      Critically ill patients:  140 - 180 mg/dL   Lab Results  Component Value Date   GLUCAP 183 (H) 03/07/2016   HGBA1C 9.5 (H) 03/06/2016   Results for ALYNE, PACE (MRN [id number]) as of 03/07/2016 16:02  Ref. Range 03/06/2016 11:36 03/06/2016 16:49 03/06/2016 22:03 03/07/2016 06:43 03/07/2016 12:11  Glucose-Capillary Latest Ref Range: 65 - 99 mg/dL 621 (H) 308 (H) 657 (H) 221 (H) 183 (H)   Review of Glycemic Control  Diabetes history: DM2 Outpatient Diabetes medications: Humalog 75/25 24 units QAM, Lantus 10 units QHS Current orders for Inpatient glycemic control: Lantus 10 QHS, Novolog moderate tidwc  Inpatient Diabetes Program Recommendations:    Increase Lantus to 18 units QHS Add Novolog 4 units tidwc for meal coverage insulin. Add Novolog HS correction  HgbA1C at 9.5% indicates poor glycemic control at home. Will need f/u with PCP for medication adjustment.  Will follow. Thank you. Ailene Ards, RD, LDN, CDE Inpatient Diabetes Coordinator [phone]

## 2016-03-07 NOTE — Progress Notes (Signed)
[facility] MD Progress Note  03/07/2016 3:42 PM Kristin Humphrey  MRN:  [id number] Subjective:   Patient seen, chart reviewed and case discussed with nursing staff.   She states that she feels better compared to yesterday. She still endorses grief after she lost her aunt; feels like she is the only person alive. She agrees that her boyfriend has been very helpful. She endorses insomnia last night secondary to pain. She feels anxious about upcoming hearing for her disability. She reports passive SI when her pain gets worse. She denies AH/VH. Of note, she reports great benefit from starting Depakote; stating that she was diagnosed bipolar disorder in the past. Denies significant manic symptoms.    Principal Problem: Severe recurrent major depression without psychotic features (HCC) Diagnosis:   Patient Active Problem List   Diagnosis Date Noted  . Chronic back pain [M54.9, G89.29] 03/05/2016  . Suicidal ideations [R45.851] 03/05/2016  . Major depressive disorder, single episode, severe without psychotic features (HCC) [F32.2]   . Severe recurrent major depression without psychotic features (HCC) [F33.2] 04/04/2015  . GAD (generalized anxiety disorder) [F41.1] 04/04/2015   Total Time spent with patient: 20 minutes  Past Psychiatric History: see HPI  Past Medical History:  Past Medical History:  Diagnosis Date  . Anxiety   . Depression   . Diabetes mellitus without complication [facility])     Past Surgical History:  Procedure Laterality Date  . ABDOMINAL HYSTERECTOMY    . BUNIONECTOMY    . NECK SURGERY     Family History:  Family History  Problem Relation Age of Onset  . Heart failure Mother   . Diabetes Mother   . Cancer Father   . Cancer Other   . Diabetes Other    Family Psychiatric  History: see HPI Social History:  History  Alcohol Use No     History  Drug Use No    Social History   Social History  . Marital status: Single    Spouse name: N/A  . Number of children: N/A  .  Years of education: N/A   Social History Main Topics  . Smoking status: Current Every Day Smoker    Packs/day: 0.50    Types: Cigarettes  . Smokeless tobacco: Never Used  . Alcohol use No  . Drug use: No  . Sexual activity: No   Other Topics Concern  . None   Social History Narrative  . None   Additional Social History:     no update                    Sleep: Poor  Appetite:  Good  Current Medications: Current Facility-Administered Medications  Medication Dose Route Frequency Provider Last Rate Last Dose  . acetaminophen (TYLENOL) tablet 650 mg  650 mg Oral Q6H PRN Truman Hayward, FNP   650 mg at 03/07/16 1119  . alum & mag hydroxide-simeth (MAALOX/MYLANTA) 200-200-20 MG/5ML suspension 30 mL  30 mL Oral Q4H PRN Truman Hayward, FNP      . baclofen (LIORESAL) tablet 10 mg  10 mg Oral BID Craige Cotta, MD   10 mg at 03/07/16 0824  . clonazePAM (KLONOPIN) tablet 0.5 mg  0.5 mg Oral TID Craige Cotta, MD   0.5 mg at 03/07/16 1119  . divalproex (DEPAKOTE) DR tablet 500 mg  500 mg Oral Q12H Craige Cotta, MD   500 mg at 03/07/16 0824  . DULoxetine (CYMBALTA) DR capsule 60 mg  60 mg  Oral Daily Craige Cotta, MD   60 mg at 03/07/16 0824  . gabapentin (NEURONTIN) capsule 800 mg  800 mg Oral QID Craige Cotta, MD   800 mg at 03/07/16 1118  . lisinopril (PRINIVIL,ZESTRIL) tablet 20 mg  20 mg Oral Daily Craige Cotta, MD   20 mg at 03/07/16 0825   And  . hydrochlorothiazide (HYDRODIURIL) tablet 25 mg  25 mg Oral Daily Craige Cotta, MD   25 mg at 03/07/16 0824  . ibuprofen (ADVIL,MOTRIN) tablet 800 mg  800 mg Oral Q8H PRN Adonis Brook, NP   800 mg at 03/07/16 1220  . insulin aspart (novoLOG) injection 0-15 Units  0-15 Units Subcutaneous TID WC Court Joy, PA-C   2 Units at 03/07/16 1218  . insulin glargine (LANTUS) injection 10 Units  10 Units Subcutaneous QHS Craige Cotta, MD   10 Units at 03/06/16 2208  . levothyroxine (SYNTHROID, LEVOTHROID)  tablet 100 mcg  100 mcg Oral QAC breakfast Craige Cotta, MD   100 mcg at 03/07/16 8366  . lidocaine (LIDODERM) 5 % 1 patch  1 patch Transdermal Q24H Court Joy, PA-C   1 patch at 03/07/16 [phone]  . magnesium hydroxide (MILK OF MAGNESIA) suspension 30 mL  30 mL Oral Daily PRN Truman Hayward, FNP      . nicotine (NICODERM CQ - dosed in mg/24 hours) patch 21 mg  21 mg Transdermal Daily Craige Cotta, MD   21 mg at 03/07/16 0826  . pantoprazole (PROTONIX) EC tablet 40 mg  40 mg Oral Daily Craige Cotta, MD   40 mg at 03/07/16 0824  . traZODone (DESYREL) tablet 50 mg  50 mg Oral QHS Craige Cotta, MD   50 mg at 03/06/16 2208  . Vitamin D (Ergocalciferol) (DRISDOL) capsule 50,000 Units  50,000 Units Oral Q7 days Craige Cotta, MD   50,000 Units at 03/05/16 1209    Lab Results:  Results for orders placed or performed during the hospital encounter of 03/05/16 (from the past 48 hour(s))  Glucose, capillary     Status: Abnormal   Collection Time: 03/05/16  5:04 PM  Result Value Ref Range   Glucose-Capillary 214 (H) 65 - 99 mg/dL  Glucose, capillary     Status: Abnormal   Collection Time: 03/05/16  9:30 PM  Result Value Ref Range   Glucose-Capillary 263 (H) 65 - 99 mg/dL  Glucose, capillary     Status: Abnormal   Collection Time: 03/06/16  6:00 AM  Result Value Ref Range   Glucose-Capillary 193 (H) 65 - 99 mg/dL   Comment 1 Notify RN   TSH     Status: None   Collection Time: 03/06/16  6:10 AM  Result Value Ref Range   TSH 1.448 0.350 - 4.500 uIU/mL    Comment: Performed at [facility]  Prolactin     Status: None   Collection Time: 03/06/16  6:10 AM  Result Value Ref Range   Prolactin 22.9 4.8 - 23.3 ng/mL    Comment: (NOTE) Performed At: [facility] [address] [place], [place] [id number] Mila Homer MD [id number] Performed at [facility]   Lipid panel, fasting     Status: Abnormal   Collection Time: 03/06/16   6:10 AM  Result Value Ref Range   Cholesterol 281 (H) 0 - 200 mg/dL   Triglycerides 174 (H) <150 mg/dL   HDL 51 >94 mg/dL  Total CHOL/HDL Ratio 5.5 RATIO   VLDL 49 (H) 0 - 40 mg/dL   LDL Cholesterol 409 (H) 0 - 99 mg/dL    Comment:        Total Cholesterol/HDL:CHD Risk Coronary Heart Disease Risk Table                     Men   Women  1/2 Average Risk   3.4   3.3  Average Risk       5.0   4.4  2 X Average Risk   9.6   7.1  3 X Average Risk  23.4   11.0        Use the calculated Patient Ratio above and the CHD Risk Table to determine the patient's CHD Risk.        ATP III CLASSIFICATION (LDL):  <100     mg/dL   Optimal  811-914  mg/dL   Near or Above                    Optimal  130-159  mg/dL   Borderline  782-956  mg/dL   High  >213     mg/dL   Very High Performed at [facility]   Hemoglobin A1c     Status: Abnormal   Collection Time: 03/06/16  6:10 AM  Result Value Ref Range   Hgb A1c MFr Bld 9.5 (H) 4.8 - 5.6 %    Comment: (NOTE)         Pre-diabetes: 5.7 - 6.4         Diabetes: >6.4         Glycemic control for adults with diabetes: <7.0    Mean Plasma Glucose 226 mg/dL    Comment: (NOTE) Performed At: [facility] [address] [place], [place] [id number] Mila Homer MD [id number] Performed at [facility]   Valproic acid level     Status: Abnormal   Collection Time: 03/06/16  6:10 AM  Result Value Ref Range   Valproic Acid Lvl 48 (L) 50.0 - 100.0 ug/mL    Comment: Performed at [facility]  Glucose, capillary     Status: Abnormal   Collection Time: 03/06/16 11:36 AM  Result Value Ref Range   Glucose-Capillary 221 (H) 65 - 99 mg/dL   Comment 1 Notify RN    Comment 2 Document in Chart   Glucose, capillary     Status: Abnormal   Collection Time: 03/06/16  4:49 PM  Result Value Ref Range   Glucose-Capillary 194 (H) 65 - 99 mg/dL   Comment 1 Notify RN    Comment 2 Document in Chart   Glucose,  capillary     Status: Abnormal   Collection Time: 03/06/16 10:03 PM  Result Value Ref Range   Glucose-Capillary 222 (H) 65 - 99 mg/dL  Glucose, capillary     Status: Abnormal   Collection Time: 03/07/16  6:43 AM  Result Value Ref Range   Glucose-Capillary 221 (H) 65 - 99 mg/dL   Comment 1 Notify RN   Glucose, capillary     Status: Abnormal   Collection Time: 03/07/16 12:11 PM  Result Value Ref Range   Glucose-Capillary 183 (H) 65 - 99 mg/dL    Blood Alcohol level:  Lab Results  Component Value Date   ETH <5 03/04/2016   ETH <5 04/03/2015    Metabolic Disorder Labs: Lab Results  Component Value Date   HGBA1C  9.5 (H) 03/06/2016   MPG 226 03/06/2016   Lab Results  Component Value Date   PROLACTIN 22.9 03/06/2016   Lab Results  Component Value Date   CHOL 281 (H) 03/06/2016   TRIG 244 (H) 03/06/2016   HDL 51 03/06/2016   CHOLHDL 5.5 03/06/2016   VLDL 49 (H) 03/06/2016   LDLCALC 181 (H) 03/06/2016    Physical Findings: AIMS: Facial and Oral Movements Muscles of Facial Expression: None, normal Lips and Perioral Area: None, normal Jaw: None, normal Tongue: None, normal,Extremity Movements Upper (arms, wrists, hands, fingers): None, normal Lower (legs, knees, ankles, toes): None, normal, Trunk Movements Neck, shoulders, hips: None, normal, Overall Severity Severity of abnormal movements (highest score from questions above): None, normal Incapacitation due to abnormal movements: None, normal Patient's awareness of abnormal movements (rate only patient's report): No Awareness, Dental Status Current problems with teeth and/or dentures?: No Does patient usually wear dentures?: No  CIWA:    COWS:     Musculoskeletal: Strength & Muscle Tone: within normal limits Gait & Station: uses walker Patient leans: N/A  Psychiatric Specialty Exam: Physical Exam  Constitutional: She is oriented to person, place, and time. She appears well-developed and well-nourished.   Neurological: She is alert and oriented to person, place, and time.  No tremors    Review of Systems  Musculoskeletal: Positive for back pain and neck pain.  All other systems reviewed and are negative.   Blood pressure 104/77, pulse (!) 121, temperature 98.2 F (36.8 C), temperature source Oral, resp. rate 12, height 5\' 4"  (1.626 m), weight 211 lb (95.7 kg), last menstrual period 04/03/2015.Body mass index is 36.22 kg/m.  General Appearance: Casual  Eye Contact:  Good  Speech:  Normal Rate  Volume:  Normal  Mood:  Depressed improving  Affect:  down  Thought Process:  Coherent and Goal Directed  Orientation:  Full (Time, Place, and Person)  Thought Content:  Logical and no parnoia, denies AH/VH  Suicidal Thoughts:  Yes.  without intent/plan  Homicidal Thoughts:  No  Memory:  intact  Judgement:  Fair  Insight:  Fair  Psychomotor Activity:  Normal  Concentration:  Concentration: Fair and Attention Span: Fair  Recall:  [employer] of Knowledge:  Fair  Language:  Good  Akathisia:  No  Handed:  Ambidextrous  AIMS (if indicated):     Assets:  Communication Skills Desire for Improvement  ADL's:  Intact  Cognition:  WNL  Sleep:  Number of Hours: 6.75   Assessment Pt endorses neurovegetative symptoms in the setting of recent loss of her aunt, pending disability hearing and chronic pain. There is an improvement in her symptoms since admission; will continue current medication regimen.   Treatment Plan Summary: Daily contact with patient to assess and evaluate symptoms and progress in treatment  Neysa Hotter, MD 03/07/2016, 3:42 PM

## 2016-03-07 NOTE — [facility] Suicide Risk Assessment (Signed)
[facility] INPATIENT:  Family/Significant Other Suicide Prevention Education  Suicide Prevention Education:  Education Completed; boyfriend Kristin Humphrey [phone],  (name of family member/significant other) has been identified by the patient as the family member/significant other with whom the patient will be residing, and identified as the person(s) who will aid the patient in the event of a mental health crisis (suicidal ideations/suicide attempt).  With written consent from the patient, the family member/significant other has been provided the following suicide prevention education, prior to the and/or following the discharge of the patient.  The suicide prevention education provided includes the following:  Suicide risk factors  Suicide prevention and interventions  National Suicide Hotline telephone number  [facility] assessment telephone number  [facility] Emergency Assistance 911  [facility] and/or Residential Mobile Crisis Unit telephone number  Request made of family/significant other to:  Remove weapons (e.g., guns, rifles, knives), all items previously/currently identified as safety concern.    Remove drugs/medications (over-the-counter, prescriptions, illicit drugs), all items previously/currently identified as a safety concern.  The family member/significant other verbalizes understanding of the suicide prevention education information provided.  The family member/significant other agrees to remove the items of safety concern listed above.  Kristin Humphrey, [name] 03/07/2016, 12:23 PM

## 2016-03-07 NOTE — Progress Notes (Signed)
Per [facility], NP, OK to give one dose PRN ibuprofen 800mg  at 1817 (early).  Given per [facility].

## 2016-03-07 NOTE — Progress Notes (Signed)
D    Pt is pleasant on approach and cooperative   She has rubbed the skin off of her right elbow from getting up and down off of the floor    She said she was hoping the doctor would put her back on her pain medications  A    Verbal support given    Medications administered and effectiveness monitored   Q 15 min checks   Covered open area on her elbow R    Pt is safe abnd receptive to verbal support

## 2016-03-07 NOTE — Progress Notes (Signed)
Nursing Note 03/07/2016 [id number]  Data Reports sleeping fair with sleep med.  Rates depression 2/10, hopelessness 1/10, and anxiety 3/10. Affect blunted but appropriate.  Denies HI, SI, AVH.  C/O chronic back pain, requesting Tramadol.  Lays in dayroom on belly with pillow and blanket underneath her "there's no other comfortable way to sit.  My bed is uncomfortable."  Requested a mat to lay on in day room.  Using walker for mobility.  Blood sugar moderately managed on current regimen.  Action Spoke with patient 1:1, nurse offered support to patient throughout shift.    Provider, physician, and charge nurse aware of pain/patient laying in day room, request for tramadol, mat, and pain.  Received order to increase ibuprofen, recommended patient try staggering ibuprofen and tylenol for relief.  She has done so.  Patient states laying on belly is the only way to get relief. Continues to be monitored on 15 minute checks for safety.  Response Did not completely stop laying in day room, but time spent on dayroom floor has greatly decreased.  States ibuprofen is  Appropriate and polite.

## 2016-03-08 DIAGNOSIS — F3130 Bipolar disorder, current episode depressed, mild or moderate severity, unspecified: Secondary | ICD-10-CM

## 2016-03-08 LAB — GLUCOSE, CAPILLARY
GLUCOSE-CAPILLARY: 201 mg/dL — AB (ref 65–99)
Glucose-Capillary: 213 mg/dL — ABNORMAL HIGH (ref 65–99)
Glucose-Capillary: 171 mg/dL — ABNORMAL HIGH (ref 65–99)

## 2016-03-08 MED ORDER — LEVOTHYROXINE SODIUM 100 MCG PO TABS: 100.0000 ug | ORAL_TABLET | Freq: Every day | ORAL | 0 refills | Status: DC

## 2016-03-08 MED ORDER — GABAPENTIN 400 MG PO CAPS: 800.0000 mg | ORAL_CAPSULE | Freq: Four times a day (QID) | ORAL | 0 refills | Status: DC

## 2016-03-08 MED ORDER — NICOTINE 21 MG/24HR TD PT24: 21.0000 mg | MEDICATED_PATCH | Freq: Every day | TRANSDERMAL | 0 refills | Status: DC

## 2016-03-08 MED ORDER — DULOXETINE HCL 60 MG PO CPEP: 60.0000 mg | ORAL_CAPSULE | Freq: Every day | ORAL | 0 refills | Status: AC

## 2016-03-08 MED ORDER — VITAMIN D (ERGOCALCIFEROL) 1.25 MG (50000 UNIT) PO CAPS: 50000.0000 [IU] | ORAL_CAPSULE | ORAL | 0 refills | Status: DC

## 2016-03-08 MED ORDER — LIDOCAINE 5 % EX PTCH: 1.0000 | MEDICATED_PATCH | CUTANEOUS | 0 refills | Status: DC

## 2016-03-08 MED ORDER — DIVALPROEX SODIUM 500 MG PO DR TAB: 500.0000 mg | DELAYED_RELEASE_TABLET | Freq: Two times a day (BID) | ORAL | 0 refills | Status: DC

## 2016-03-08 MED ORDER — BACLOFEN 10 MG PO TABS: 10.0000 mg | ORAL_TABLET | Freq: Two times a day (BID) | ORAL | 0 refills | Status: DC

## 2016-03-08 MED ORDER — PANTOPRAZOLE SODIUM 40 MG PO TBEC: 40.0000 mg | DELAYED_RELEASE_TABLET | Freq: Every day | ORAL | 0 refills | Status: DC

## 2016-03-08 MED ORDER — LISINOPRIL-HYDROCHLOROTHIAZIDE 20-25 MG PO TABS: 1.0000 | ORAL_TABLET | Freq: Every day | ORAL | 0 refills | Status: DC

## 2016-03-08 MED ORDER — TRAZODONE HCL 50 MG PO TABS: 50.0000 mg | ORAL_TABLET | Freq: Every day | ORAL | 0 refills | Status: DC

## 2016-03-08 NOTE — Discharge Summary (Signed)
Physician Discharge Summary Note  Patient:  Kristin Humphrey is an 45 y.o., female MRN:  [id number] DOB:  03/28/1971 Patient phone:  [phone] (home)  Patient address:   [address] [place] [address],  Total Time spent with patient: 30 minutes  Date of Admission:  03/05/2016 Date of Discharge: 03/08/2016  Reason for Admission:  Depression, anxiety due to father's death  Principal Problem: Severe recurrent major depression without psychotic features [facility]) Discharge Diagnoses: Patient Active Problem List   Diagnosis Date Noted  . Chronic back pain [M54.9, G89.29] 03/05/2016  . Suicidal ideations [R45.851] 03/05/2016  . Major depressive disorder, single episode, severe without psychotic features (HCC) [F32.2]   . Severe recurrent major depression without psychotic features (HCC) [F33.2] 04/04/2015  . GAD (generalized anxiety disorder) [F41.1] 04/04/2015    Past Psychiatric History: see HPI  Past Medical History:  Past Medical History:  Diagnosis Date  . Anxiety   . Depression   . Diabetes mellitus without complication [facility])     Past Surgical History:  Procedure Laterality Date  . ABDOMINAL HYSTERECTOMY    . BUNIONECTOMY    . NECK SURGERY     Family History:  Family History  Problem Relation Age of Onset  . Heart failure Mother   . Diabetes Mother   . Cancer Father   . Cancer Other   . Diabetes Other    Family Psychiatric  History: see HPI Social History:  History  Alcohol Use No     History  Drug Use No    Social History   Social History  . Marital status: Single    Spouse name: N/A  . Number of children: N/A  . Years of education: N/A   Social History Main Topics  . Smoking status: Current Every Day Smoker    Packs/day: 0.50    Types: Cigarettes  . Smokeless tobacco: Never Used  . Alcohol use No  . Drug use: No  . Sexual activity: No   Other Topics Concern  . None   Social History Narrative  . None    Hospital Course:  Kristin Humphrey,  45 year old female, states she has been struggling with depression, anxiety, which she attributes to the death of her father a little more than a year ago, and also to chronic back pain which adds to stress and her inability to seek medical care for it due to insurance.    Kristin Humphrey was admitted for Severe recurrent major depression without psychotic features (HCC) and crisis management.  She was treated with meds listed below and their indications.  Medical problems were identified and treated as needed.  Home medications were restarted as appropriate.  Improvement was monitored by observation and Kristin Humphrey daily report of symptom reduction.  Emotional and mental status was monitored by daily self inventory reports completed by Kristin Humphrey and clinical staff.  Patient reported continued improvement, denied any new concerns.  Patient had been compliant on medications and denied side effects.  Support and encouragement was provided.    At time of discharge, patient rated both depression and anxiety levels to be manageable and minimal.  Patient encouraged to attend groups to help with recognizing triggers of emotional crises and de-stabilizations.  Patient encouraged to attend group to help identify the positive things in life that would help in dealing with feelings of loss, depression and unhealthy or abusive tendencies.         Kristin Humphrey was  evaluated by the treatment team for stability and plans for continued recovery upon discharge.  She was offered further treatment options upon discharge including Residential, Intensive Outpatient and Outpatient treatment.  She will follow up with agency listed below for medication management and counseling.  Encouraged patient to maintain satisfactory support network and home environment.  Advised to adhere to medication compliance and outpatient treatment follow up.  Prescriptions provided.       Kristin Humphrey motivation was an integral factor for  scheduling further treatment.  Employment, transportation, bed availability, health status, family support, and any pending legal issues were also considered during her hospital stay.  Upon completion of this admission the patient was both mentally and medically stable for discharge denying suicidal/homicidal ideation, auditory/visual/tactile hallucinations, delusional thoughts and paranoia.      Physical Findings: AIMS: Facial and Oral Movements Muscles of Facial Expression: None, normal Lips and Perioral Area: None, normal Jaw: None, normal Tongue: None, normal,Extremity Movements Upper (arms, wrists, hands, fingers): None, normal Lower (legs, knees, ankles, toes): None, normal, Trunk Movements Neck, shoulders, hips: None, normal, Overall Severity Severity of abnormal movements (highest score from questions above): None, normal Incapacitation due to abnormal movements: None, normal Patient's awareness of abnormal movements (rate only patient's report): No Awareness, Dental Status Current problems with teeth and/or dentures?: No Does patient usually wear dentures?: No  CIWA:    COWS:     Musculoskeletal: Strength & Muscle Tone: within normal limits Gait & Station: normal Patient leans: N/A  Psychiatric Specialty Exam:  See MD SRA Physical Exam  ROS  Blood pressure 122/83, pulse (!) 103, temperature 98.4 F (36.9 C), temperature source Oral, resp. rate 16, height 5\' 4"  (1.626 m), weight 95.7 kg (211 lb), last menstrual period 04/03/2015.Body mass index is 36.22 kg/m.    Have you used any form of tobacco in the last 30 days? (Cigarettes, Smokeless Tobacco, Cigars, and/or Pipes): Yes  Has this patient used any form of tobacco in the last 30 days? (Cigarettes, Smokeless Tobacco, Cigars, and/or Pipes) Yes, N/A  Blood Alcohol level:  Lab Results  Component Value Date   ETH <5 03/04/2016   ETH <5 04/03/2015    Metabolic Disorder Labs:  Lab Results  Component Value Date   HGBA1C  9.5 (H) 03/06/2016   MPG 226 03/06/2016   Lab Results  Component Value Date   PROLACTIN 22.9 03/06/2016   Lab Results  Component Value Date   CHOL 281 (H) 03/06/2016   TRIG 244 (H) 03/06/2016   HDL 51 03/06/2016   CHOLHDL 5.5 03/06/2016   VLDL 49 (H) 03/06/2016   LDLCALC 181 (H) 03/06/2016    See Psychiatric Specialty Exam and Suicide Risk Assessment completed by Attending Physician prior to discharge.  Discharge destination:  Home  Is patient on multiple antipsychotic therapies at discharge:  No   Has Patient had three or more failed trials of antipsychotic monotherapy by history:  No  Recommended Plan for Multiple Antipsychotic Therapies: NA     Medication List    STOP taking these medications   clonazePAM 0.5 MG tablet Commonly known as:  KLONOPIN   ferrous sulfate 325 (65 FE) MG tablet   HYDROcodone-acetaminophen 10-325 MG tablet Commonly known as:  NORCO   ibuprofen 800 MG tablet Commonly known as:  ADVIL,MOTRIN   insulin aspart 100 UNIT/ML injection Commonly known as:  novoLOG   insulin glargine 100 UNIT/ML injection Commonly known as:  LANTUS   insulin lispro protamine-lispro (75-25) 100 UNIT/ML Susp injection  Commonly known as:  HUMALOG 75/25 MIX   methocarbamol 750 MG tablet Commonly known as:  ROBAXIN   multivitamin with minerals Tabs tablet   ondansetron 4 MG tablet Commonly known as:  ZOFRAN   oxybutynin 5 MG tablet Commonly known as:  DITROPAN     TAKE these medications     Indication  baclofen 10 MG tablet Commonly known as:  LIORESAL Take 1 tablet (10 mg total) by mouth 2 (two) times daily.  Indication:  Muscle Spasticity   divalproex 500 MG DR tablet Commonly known as:  DEPAKOTE Take 1 tablet (500 mg total) by mouth every 12 (twelve) hours.  Indication:  Mood stabilization   DULoxetine 60 MG capsule Commonly known as:  CYMBALTA Take 1 capsule (60 mg total) by mouth daily.  Indication:  Major Depressive Disorder    gabapentin 400 MG capsule Commonly known as:  NEURONTIN Take 2 capsules (800 mg total) by mouth 4 (four) times daily.  Indication:  Neuropathic Pain   levothyroxine 100 MCG tablet Commonly known as:  SYNTHROID, LEVOTHROID Take 1 tablet (100 mcg total) by mouth daily before breakfast.  Indication:  Underactive Thyroid   lidocaine 5 % Commonly known as:  LIDODERM Place 1 patch onto the skin daily. Remove & Discard patch within 12 hours or as directed by MD  Indication:  Allodynia   lisinopril-hydrochlorothiazide 20-25 MG tablet Commonly known as:  PRINZIDE,ZESTORETIC Take 1 tablet by mouth daily.  Indication:  High Blood Pressure   nicotine 21 mg/24hr patch Commonly known as:  NICODERM CQ - dosed in mg/24 hours Place 1 patch (21 mg total) onto the skin daily.  Indication:  Nicotine Addiction   pantoprazole 40 MG tablet Commonly known as:  PROTONIX Take 1 tablet (40 mg total) by mouth daily.  Indication:  Gastroesophageal Reflux Disease   traZODone 50 MG tablet Commonly known as:  DESYREL Take 1 tablet (50 mg total) by mouth at bedtime.  Indication:  Trouble Sleeping   Vitamin D (Ergocalciferol) 50000 units Caps capsule Commonly known as:  DRISDOL Take 1 capsule (50,000 Units total) by mouth every 7 (seven) days. What changed:  additional instructions  Indication:  Vitamin D Deficiency      Follow-up Information    [facility] Follow up on 03/17/2016.   Why:  Appointment with case manager Kristin Humphrey on Friday August 11th at 12pm. Medication management appt on same day at 12:40pm with Dr. Chipper Herb. Call office if you need to reschedule.  Contact information: Main Office [address], [place] [address] Phone: [phone] fax: [phone]          Follow-up recommendations:  Activity:  as tol Diet:  as tol  Comments:  1.  Take all your medications as prescribed.   2.  Report any adverse side effects to outpatient  provider. 3.  Patient instructed to not use alcohol or illegal drugs while on prescription medicines. 4.  In the event of worsening symptoms, instructed patient to call 911, the crisis hotline or go to nearest emergency room for evaluation of symptoms.  Signed: Lindwood Qua, NP [facility] 03/08/2016, 10:22 AM   Patient seen, Suicide Assessment Completed.  Disposition Plan Reviewed

## 2016-03-08 NOTE — Progress Notes (Signed)
Pleasant and appropriate today.  C/O back pain which patient is managing with tylenol and ibuprofen and ice.  Denied other concerns.  Received discharge orders.  Reviewed medications, discharge instructions, and follow up appointments with patient verbalized understanding.  Patient denied SI, HI, and AVH.  Agrees to contact someone or 911 if she has thoughts/intent to harm self or others.    Medication scripts handed to patient.  Paperwork, AVS, SRA, and transition record handed to patient.   Escorted off of unit at 1645. Belongings returned per belongings form.  Discharged to lobby to husband.  To follow up per AVS.

## 2016-03-08 NOTE — Progress Notes (Signed)
Recreation Therapy Notes  Date: 03/08/16 Time: 0930 Location: 300 [address] Group Room  Group Topic: Stress Management  Goal Area(s) Addresses:  Patient will verbalize importance of using healthy stress management.  Patient will identify positive emotions associated with healthy stress management.   Intervention: Stress Management   Activity :  Progressive Muscle Relaxation.  LRT introduced the technique of progressive muscle relaxation to patients.  Patients were asked to follow along with LRT as a script was read to guide patients through the activity.  Education:  Stress Management, Discharge Planning.    Clinical Observations/Feedback: Pt did not attend group.   Nayzeth Altman, LRT/CTRS  

## 2016-03-08 NOTE — Tx Team (Signed)
Interdisciplinary Treatment Plan Update (Adult) Date: 03/08/2016    Time Reviewed: 9:30 AM  Progress in Treatment: Attending groups: Yes Participating in groups: Yes Taking medication as prescribed: Yes Tolerating medication: Yes Family/Significant other contact made: Yes, CSW spoke with boyfriend Patient understands diagnosis: Yes Discussing patient identified problems/goals with staff: Yes Medical problems stabilized or resolved: Yes Denies suicidal/homicidal ideation: Yes Issues/concerns per patient self-inventory: Yes Other:  New problem(s) identified: N/A  Discharge Plan or Barriers: Home with outpatient services.   Reason for Continuation of Hospitalization:  Depression Anxiety Medication Stabilization   Comments: N/A  Estimated length of stay: Discharge anticipated for today 03/08/16   Patient is a 45 year old female who presented to the hospital with Major Depressive Disorder and SI. Primary triggers for admission include chronic pain issues. Patient will benefit from crisis stabilization, medication evaluation, group therapy and psycho education in addition to case management for discharge planning. At discharge, it is recommended that Pt remain compliant with established discharge plan and continued treatment.   Review of initial/current patient goals per problem list:  1. Goal(s): Patient will participate in aftercare plan   Met: Yes   Target date: 3-5 days post admission date   As evidenced by: Patient will participate within aftercare plan AEB aftercare provider and housing plan at discharge being identified.  7/31: Goal met. Patient plans to return home to follow up with outpatient services.    2. Goal (s): Patient will exhibit decreased depressive symptoms and suicidal ideations.   Met: Adequate for discharge per MD   Target date: 3-5 days post admission date   As evidenced by: Patient will utilize self rating of depression at 3 or below and  demonstrate decreased signs of depression or be deemed stable for discharge by MD.  7/30: Patient rates depression 9, endorses passive SI.   8/2: Adequate for discharge per MD. Patient reports some depression related to her chronic pain issues. She denies SI and reports feeling safe for discharge at this time.     3. Goal(s): Patient will demonstrate decreased signs and symptoms of anxiety.   Met:  Adequate for discharge per MD.   Target date: 3-5 days post admission date   As evidenced by: Patient will utilize self rating of anxiety at 3 or below and demonstrated decreased signs of anxiety, or be deemed stable for discharge by MD  7/30: Goal not met: Pt presents with anxious mood and affect.  Pt admitted with high anxiety levels. Pt to show decreased sign of anxiety and a rating of 3 or less before d/c.  8/2: Adequate for discharge per MD. Patient reports some anxiety related to her chronic pain issues. She denies SI and reports feeling safe for discharge at this time.      Attendees: Patient:    Family:    Physician: Dr. Parke Poisson; Dr. Shea Evans; Dr. Modesta Messing  03/08/2016 9:30 AM  Nursing: Elesa Massed, Otilio Carpen, RN 03/08/2016 9:30 AM  Clinical Social Worker: Erasmo Downer Vitoria Conyer, LCSW 03/08/2016 9:30 AM  Other: Peri Maris, LCSW; [facility], LCSW  03/08/2016 9:30 AM  Other:  03/08/2016 9:30 AM  Other: Lars Pinks, Case Manager 03/08/2016 9:30 AM  Other:  Lockie Mola, May Augustin, NP 03/08/2016 9:30 AM  Other:    Other:     Scribe for Treatment Team:  Tilden Fossa, [place]

## 2016-03-08 NOTE — Progress Notes (Signed)
  [facility] Adult Case Management Discharge Plan :  Will you be returning to the same living situation after discharge:  Yes,  patient plans to return home At discharge, do you have transportation home?: Yes,  boyfriend Do you have the ability to pay for your medications: Yes,  patient will be provided with prescriptons at dicharge  Release of information consent forms completed and in the chart;  Patient's signature needed at discharge.  Patient to Follow up at: Follow-up Information    [facility] Follow up on 03/17/2016.   Why:  Appointment with case manager Michela Pitcher on Friday August 11th at 12pm. Medication management appt on same day at 12:40pm with Dr. Chipper Herb. Call office if you need to reschedule.  Contact information: Main Office [address], [place] [address] Phone: [phone] fax: [phone]          Next level of care provider has access to [facility] Link:no  Safety Planning and Suicide Prevention discussed: Yes,  with patient and boyfriend  Have you used any form of tobacco in the last 30 days? (Cigarettes, Smokeless Tobacco, Cigars, and/or Pipes): Yes  Has patient been referred to the Quitline?: Patient refused referral  Patient has been referred for addiction treatment: Yes  Damaria Vachon, [name] 03/08/2016, 10:38 AM

## 2016-03-08 NOTE — BHH Group Notes (Addendum)
Late Entry from 03/07/16:   [facility] LCSW Group Therapy  03/07/16 1:15 PM   Type of Therapy:  Group Therapy  Participation Level:  Active  Participation Quality:  Attentive, Sharing and Supportive  Affect:  Blunted  Cognitive:  Alert and Oriented  Insight:  Developing/Improving and Engaged  Engagement in Therapy:  Developing/Improving and Engaged  Modes of Intervention:  Clarification, Confrontation, Discussion, Education, Exploration, Limit-setting, Orientation, Problem-solving, Rapport Building, Dance movement psychotherapist, Socialization and Support  Summary of Progress/Problems: The topic for group therapy was feelings about diagnosis.  Pt actively participated in group discussion on their past and current diagnosis and how they feel towards this.  Pt also identified how society and family members judge them, based on their diagnosis as well as stereotypes and stigmas.  Patient discussed feeling judged by others in her life who did not understand her mental illness and labeled her as "crazy" due to Bipolar diagnosis. Group discussed stereotypes and stigma of mental illness.   Samuella Bruin, MSW, LCSW Clinical Social Worker [facility] [phone]

## 2016-03-08 NOTE — BHH Suicide Risk Assessment (Addendum)
[facility] Discharge Suicide Risk Assessment   Principal Problem: Severe recurrent major depression without psychotic features [facility]) Discharge Diagnoses:  Patient Active Problem List   Diagnosis Date Noted  . Chronic back pain [M54.9, G89.29] 03/05/2016  . Suicidal ideations [R45.851] 03/05/2016  . Major depressive disorder, single episode, severe without psychotic features (HCC) [F32.2]   . Severe recurrent major depression without psychotic features (HCC) [F33.2] 04/04/2015  . GAD (generalized anxiety disorder) [F41.1] 04/04/2015    Total Time spent with patient: 30 minutes  Musculoskeletal: Strength & Muscle Tone: within normal limits Gait & Station: painful gait due to lower back pain Patient leans: N/A  Psychiatric Specialty Exam: ROS denies headache, no chest pain, no shortness of breath, no vomiting, chronic back and neck pain, which make sitting difficult   Blood pressure 122/83, pulse (!) 103, temperature 98.4 F (36.9 C), temperature source Oral, resp. rate 16, height  (1.626 m), weight 211 lb (95.7 kg), last menstrual period 04/03/2015.Body mass index is 36.22 kg/m.  General Appearance: Well Groomed  Eye Contact::  Good  Speech:  Normal Rate409  Volume:  Normal  Mood:  improved mood, feels "OK", minimizes depression at this time  Affect:  Appropriate  Thought Process:  Linear  Orientation:  Full (Time, Place, and Person)  Thought Content:  no hallucinations, no delusions , not internally preoccupied   Suicidal Thoughts:  No- denies any suicidal or self injurious ideations, no homicidal or violent ideations  Homicidal Thoughts:  No  Memory:  recent and remote grossly intact   Judgement:  Other:  improved   Insight:  improved   Psychomotor Activity:  Normal  Concentration:  Good  Recall:  Good  Fund of Knowledge:Good  Language: Negative  Akathisia:  Negative  Handed:  Right  AIMS (if indicated):     Assets:  Desire for Improvement Resilience  Sleep:  Number of  Hours: 6  Cognition: WNL  ADL's:  Intact   Mental Status Per Nursing Assessment::   On Admission:  Suicidal ideation indicated by patient, Suicide plan  Demographic Factors:  45 year old single female, lives with boyfriend, no children  Loss Factors: Chronic back pain, loss of loved ones , insurance constraints regarding getting surgical care for her chronic back pain  Historical Factors: History of depression and prior psychiatric admissions   Risk Reduction Factors:   Sense of responsibility to family  Continued Clinical Symptoms:  At this time patient is alert, attentive, well related , mood improved and denies depression at this time, affect appropriate, more reactive, no thought disorder, no suicidal or homicidal ideations, no psychotic symptoms , future oriented, states that she plans to keep her upcoming disability determination hearing in 2-3 weeks   Cognitive Features That Contribute To Risk:  No gross cognitive deficits noted upon discharge. Is alert , attentive, and oriented x 3   Suicide Risk:  Mild:  Suicidal ideation of limited frequency, intensity, duration, and specificity.  There are no identifiable plans, no associated intent, mild dysphoria and related symptoms, good self-control (both objective and subjective assessment), few other risk factors, and identifiable protective factors, including available and accessible social support.  Follow-up Information    [facility] Follow up on 03/17/2016.   Why:  Appointment with case manager Michela Pitcher on Friday August 11th at 12pm. Medication management appt on same day at 12:40pm with Dr. Chipper Herb. Call office if you need to reschedule.  Contact information: Main Office [address], [place] [address] Phone: [phone]  fax: [phone]          Plan Of Care/Follow-up recommendations:  Activity:  as tolerated  Diet:  Diabetic Diet Tests:  NA Other:  see below  Patient is  leaving unit in good spirits. Plans to return home  States boyfriend will come to pick her up later today. Plans to follow up with PCP for management of medical issues and pain Linea Calles, MD 03/08/2016, 11:55 AM

## 2016-12-25 ENCOUNTER — Encounter (HOSPITAL_COMMUNITY): Payer: Self-pay | Admitting: Emergency Medicine

## 2016-12-25 ENCOUNTER — Emergency Department (HOSPITAL_COMMUNITY)
Admission: EM | Admit: 2016-12-25 | Discharge: 2016-12-26 | Disposition: A | Discharge status: Other Acute Inpatient Hospital | Payer: Medicaid - Out of State | Attending: Emergency Medicine | Admitting: Emergency Medicine

## 2016-12-25 DIAGNOSIS — F419 Anxiety disorder, unspecified: Secondary | ICD-10-CM

## 2016-12-25 DIAGNOSIS — R45851 Suicidal ideations: Secondary | ICD-10-CM

## 2016-12-25 DIAGNOSIS — Z79899 Other long term (current) drug therapy: Secondary | ICD-10-CM | POA: Insufficient documentation

## 2016-12-25 DIAGNOSIS — E119 Type 2 diabetes mellitus without complications: Secondary | ICD-10-CM | POA: Insufficient documentation

## 2016-12-25 DIAGNOSIS — Z794 Long term (current) use of insulin: Secondary | ICD-10-CM | POA: Diagnosis not present

## 2016-12-25 DIAGNOSIS — F17210 Nicotine dependence, cigarettes, uncomplicated: Secondary | ICD-10-CM | POA: Insufficient documentation

## 2016-12-25 HISTORY — DX: Other chronic pain: G89.29

## 2016-12-25 LAB — COMPREHENSIVE METABOLIC PANEL
ALT: 21 U/L (ref 14–54)
AST: 22 U/L (ref 15–41)
Albumin: 4 g/dL (ref 3.5–5.0)
Alkaline Phosphatase: 90 U/L (ref 38–126)
Anion gap: 11 (ref 5–15)
BILIRUBIN TOTAL: 0.5 mg/dL (ref 0.3–1.2)
BUN: <5 mg/dL — ABNORMAL LOW (ref 6–20)
CHLORIDE: 97 mmol/L — AB (ref 101–111)
CO2: 27 mmol/L (ref 22–32)
CREATININE: 0.71 mg/dL (ref 0.44–1.00)
Calcium: 9.4 mg/dL (ref 8.9–10.3)
Glucose, Bld: 279 mg/dL — ABNORMAL HIGH (ref 65–99)
POTASSIUM: 3.9 mmol/L (ref 3.5–5.1)
Sodium: 135 mmol/L (ref 135–145)
TOTAL PROTEIN: 8.1 g/dL (ref 6.5–8.1)

## 2016-12-25 LAB — ETHANOL

## 2016-12-25 LAB — CBC
HCT: 47.1 % — ABNORMAL HIGH (ref 36.0–46.0)
Hemoglobin: 15 g/dL (ref 12.0–15.0)
MCH: 26.7 pg (ref 26.0–34.0)
MCHC: 31.8 g/dL (ref 30.0–36.0)
MCV: 83.8 fL (ref 78.0–100.0)
PLATELETS: 226 10*3/uL (ref 150–400)
RBC: 5.62 MIL/uL — AB (ref 3.87–5.11)
RDW: 14.5 % (ref 11.5–15.5)
WBC: 14.5 10*3/uL — ABNORMAL HIGH (ref 4.0–10.5)

## 2016-12-25 LAB — RAPID URINE DRUG SCREEN, HOSP PERFORMED
AMPHETAMINES: NOT DETECTED
Barbiturates: NOT DETECTED
Benzodiazepines: POSITIVE — AB
Cocaine: NOT DETECTED
OPIATES: NOT DETECTED
Tetrahydrocannabinol: NOT DETECTED

## 2016-12-25 LAB — ACETAMINOPHEN LEVEL: Acetaminophen (Tylenol), Serum: <10 ug/mL — ABNORMAL LOW (ref 10–30)

## 2016-12-25 LAB — CBG MONITORING, ED: GLUCOSE-CAPILLARY: 225 mg/dL — AB (ref 65–99)

## 2016-12-25 LAB — SALICYLATE LEVEL

## 2016-12-25 MED ORDER — BACLOFEN 10 MG PO TABS
10.0000 mg | ORAL_TABLET | Freq: Two times a day (BID) | ORAL | Status: DC
  Administered 2016-12-25: 10 mg via ORAL
  Filled 2016-12-25: qty 1

## 2016-12-25 MED ORDER — NICOTINE 21 MG/24HR TD PT24: 21.0000 mg | MEDICATED_PATCH | Freq: Every day | TRANSDERMAL | Status: DC

## 2016-12-25 MED ORDER — LISINOPRIL-HYDROCHLOROTHIAZIDE 20-25 MG PO TABS: 1.0000 | ORAL_TABLET | Freq: Every day | ORAL | Status: DC

## 2016-12-25 MED ORDER — DULOXETINE HCL 30 MG PO CPEP: 60.0000 mg | ORAL_CAPSULE | Freq: Every day | ORAL | Status: DC

## 2016-12-25 MED ORDER — ACETAMINOPHEN 325 MG PO TABS: 650.0000 mg | ORAL_TABLET | ORAL | Status: DC | PRN

## 2016-12-25 MED ORDER — HYDROCHLOROTHIAZIDE 25 MG PO TABS: 25.0000 mg | ORAL_TABLET | Freq: Every day | ORAL | Status: DC

## 2016-12-25 MED ORDER — GABAPENTIN 400 MG PO CAPS
800.0000 mg | ORAL_CAPSULE | Freq: Four times a day (QID) | ORAL | Status: DC
  Administered 2016-12-25: 800 mg via ORAL
  Filled 2016-12-25: qty 2

## 2016-12-25 MED ORDER — ONDANSETRON HCL 4 MG PO TABS: 4.0000 mg | ORAL_TABLET | Freq: Three times a day (TID) | ORAL | Status: DC | PRN

## 2016-12-25 MED ORDER — LORAZEPAM 1 MG PO TABS
1.0000 mg | ORAL_TABLET | Freq: Once | ORAL | Status: AC
  Administered 2016-12-25: 1 mg via ORAL
  Filled 2016-12-25: qty 1

## 2016-12-25 MED ORDER — LEVOTHYROXINE SODIUM 50 MCG PO TABS: 100.0000 ug | ORAL_TABLET | Freq: Every day | ORAL | Status: DC

## 2016-12-25 MED ORDER — DIVALPROEX SODIUM 250 MG PO DR TAB
500.0000 mg | DELAYED_RELEASE_TABLET | Freq: Two times a day (BID) | ORAL | Status: DC
  Administered 2016-12-25: 500 mg via ORAL
  Filled 2016-12-25: qty 2

## 2016-12-25 MED ORDER — LISINOPRIL 10 MG PO TABS: 20.0000 mg | ORAL_TABLET | Freq: Every day | ORAL | Status: DC

## 2016-12-25 MED ORDER — LORAZEPAM 1 MG PO TABS: 1.0000 mg | ORAL_TABLET | Freq: Three times a day (TID) | ORAL | Status: DC | PRN

## 2016-12-25 MED ORDER — PANTOPRAZOLE SODIUM 40 MG PO TBEC: 40.0000 mg | DELAYED_RELEASE_TABLET | Freq: Every day | ORAL | Status: DC

## 2016-12-25 MED ORDER — TRAZODONE HCL 50 MG PO TABS
50.0000 mg | ORAL_TABLET | Freq: Every day | ORAL | Status: DC
  Administered 2016-12-25: 50 mg via ORAL
  Filled 2016-12-25: qty 1

## 2016-12-25 MED ORDER — ALUM & MAG HYDROXIDE-SIMETH 200-200-20 MG/5ML PO SUSP: 30.0000 mL | ORAL | Status: DC | PRN

## 2016-12-25 MED ORDER — IBUPROFEN 400 MG PO TABS: 600.0000 mg | ORAL_TABLET | Freq: Three times a day (TID) | ORAL | Status: DC | PRN

## 2016-12-25 NOTE — BH Assessment (Signed)
Tele Assessment Note   Kristin FeilMary A Humphrey is an 46 y.o. female who voluntarily reported the ED due to SI with intent and a plan.  Kristin Humphrey denies HI and AVH.  Kristin Humphrey has a history of suicidal gestures and sts she attempted two times before.  Kristin Humphrey sts she has chronic pain suffered during a work related injury. Kristin Humphrey sts due to the pain and inability to enjoy life she has no desire to live.  Kristin Humphrey endorses sadness, guilt, isolation, loss of pleasure of activities.  Kristin Humphrey sts she is medication compliant and sees a psychiatrist.  Additionally, Kristin Humphrey sts she sees a therapist and is in the process of getting another therapist.  Kristin Humphrey sts she lives alone and can return to her residence. Kristin Humphrey sts she does have a significant other who comes to see her during the week.  However, due to the pain, Kristin Humphrey sts she is depressed.  Kristin Humphrey sts she has a history of alcohol abuse.  However, Kristin Humphrey denies current abuse of alcohol and illicit drugs.   Kristin Humphrey presented in hospital scrubs and was well groomed. Kristin Humphrey had good eye contact and coherent speech. Kristin Humphrey had freedom of movement. Kristin Humphrey had a depressed mood.  Her affect was congruent with her mood; however, she did display a sense of humor.  Her thought process was coherent; however, her judgment impaired and her insight poor.  Kristin Humphrey was 4x's oriented.  Kristin Humphrey meets criteria for inpatient services and was recommended for the Observation Unit for further evaluation per Donell SievertSpencer Simon.  Diagnosis: Major Depression    Past Medical History:  Past Medical History:  Diagnosis Date  . Anxiety   . Chronic pain   . Depression   . Diabetes mellitus without complication [facility](HCC)     Past Surgical History:  Procedure Laterality Date  . ABDOMINAL HYSTERECTOMY    . BUNIONECTOMY    . NECK SURGERY      Family History:  Family History  Problem Relation Age of Onset  . Heart failure Mother   . Diabetes Mother   . Cancer Father   . Cancer Other   . Diabetes Other     Social History:  reports  that she has been smoking Cigarettes.  She has been smoking about 0.50 packs per day. She has never used smokeless tobacco. She reports that she does not drink alcohol or use drugs.  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 ) COWS:    PATIENT STRENGTHS: (choose at least two) Active sense of humor Average or above average intelligence Capable of independent living Supportive family/friends  Allergies:  Allergies  Allergen Reactions  . Adhesive [Tape] Other (See Comments)    Causes blisters and redness. Prefers paper tape   . Celebrex [Celecoxib] Other (See Comments)    Pt reports is makes her"hurt worse"  . Zomig [Zolmitriptan] Other (See Comments)    Chest tightness    Home Medications:  (Not in a hospital admission)  OB/GYN Status:  Patient's last menstrual period was 04/03/2015.  General Assessment Data Location of Assessment: [facility] TTS Assessment: In system Is this a Tele or Face-to-Face Assessment?: Tele Assessment Is this an Initial Assessment or a Re-assessment for this encounter?: Initial Assessment Marital status: Single Maiden name: Starn Is patient pregnant?: No Pregnancy Status: No Living Arrangements: Alone Can pt return to current living arrangement?: Yes Admission Status: Voluntary Is patient capable of signing voluntary admission?: Yes Referral Source: Self/Family/Friend Insurance type: Medicaid     Crisis Care Plan Living Arrangements:  Alone Legal Guardian: Other: (Self) Name of Psychiatrist: Dr. Chipper Herb Name of Therapist: Meriam Sprague [facility]  Education Status Is patient currently in school?: No Highest grade of school patient has completed: GED  Risk to self with the past 6 months Suicidal Ideation: Yes-Currently Present Has patient been a risk to self within the past 6 months prior to admission? : Yes Suicidal Intent: Yes-Currently Present Has patient had any suicidal intent within the past 6 months prior to admission? : Yes Is patient at risk for suicide?: Yes Suicidal Plan?: Yes-Currently Present Has patient had any suicidal plan within the past 6 months prior to admission? : Yes Specify Current Suicidal Plan: Pt sts their is a train track and [employer] goes through every night Access to Means: Yes Specify Access to Suicidal Means: Yes...train track near her house What has been your use of drugs/alcohol within the last 12 months?: none reported Previous Attempts/Gestures: Yes How many times?: 2 Other Self Harm Risks: None reported Triggers for Past Attempts: Other (Comment) (Pt sts her dad was dx with cancer and mom dying) Intentional Self Injurious Behavior: None Family Suicide History: No Recent stressful life event(s): Other (Comment), Job Loss, Loss (Comment) (Pt sts pain, losses, and inabilty to enjoy life) Persecutory voices/beliefs?: Yes Depression: Yes Depression Symptoms: Tearfulness, Isolating, Fatigue, Loss of interest in usual pleasures, Feeling angry/irritable, Feeling worthless/self pity Substance abuse history and/or treatment for substance abuse?: Yes Suicide prevention information given to non-admitted patients: Not applicable  Risk to Others within the past 6 months Homicidal Ideation: No Does patient have any lifetime risk of violence toward others beyond the six months prior to admission? : No Thoughts of Harm to Others: No Current Homicidal Intent: No Current  Homicidal Plan: No Access to Homicidal Means: No Identified Victim: N/A History of harm to others?: No Assessment of Violence: None Noted Violent Behavior Description: None reported Does patient have access to weapons?: No Criminal Charges Pending?: No Does patient have a court date: No Is patient on probation?: No  Psychosis Hallucinations: None noted Delusions: None noted  Mental Status Report Appearance/Hygiene: In scrubs Eye Contact: Good Motor Activity: Psychomotor retardation Speech: Logical/coherent Level of Consciousness: Crying, Alert Mood: Depressed, Sad, Fearful Affect: Anxious, Depressed, Fearful, Sad Anxiety Level: Severe Thought Processes: Coherent Judgement: Impaired Orientation: Person, Place, Time, Situation Obsessive Compulsive Thoughts/Behaviors: None  Cognitive Functioning Concentration: Normal Memory: Recent Intact, Remote Intact IQ: Average Impulse Control: Fair Appetite: Good Weight Loss: 0 Weight Gain: 13 (Pt sts she has gained 13lbs since October 16) Sleep: Decreased Total Hours of Sleep: 4 Vegetative Symptoms: None  ADLScreening [facility] Assessment Services) Patient's cognitive ability adequate to safely complete daily activities?: Yes Patient able to express need for assistance with ADLs?: Yes Independently performs ADLs?: Yes (appropriate for developmental age)  Prior Inpatient Therapy Prior Inpatient Therapy: Yes Prior Therapy Dates: 2016, 2015, 1995 Prior Therapy Facilty/Provider(s): Pt sts [facility] (2015) [facility] [place], Reason for Treatment: Depression/SI  Prior Outpatient Therapy Prior Outpatient Therapy: Yes Prior Therapy Dates: current Prior Therapy Facilty/Provider[facility]): [facility] Reason for Treatment: Depression/SI Does patient have an ACCT team?: No Does patient have Intensive In-House Services?  : No Does patient have [facility] services? : No Does patient have P4CC services?: No  ADL Screening (condition at time  of admission) Patient's cognitive ability adequate to safely complete daily activities?: Yes Patient able to express need for assistance with ADLs?: Yes Independently performs ADLs?: Yes (appropriate for developmental age)       Abuse/Neglect Assessment (Assessment to be complete while patient is alone) Physical Abuse: Denies Verbal Abuse: Yes, past (Comment) (Pt sts growing up her entire life, her daddy...would throw up at 3pm when she knew he was coming home) Sexual Abuse: Denies Exploitation of patient/patient's resources: Denies Self-Neglect: (S) Yes, past (Comment) (Pt sts, "I go sometimes 3, 4, 5 days without taking a shower") Values / Beliefs Cultural Requests During Hospitalization: Other (comment) (Pt sts she would like food brought to her only because she can't stand up.) Spiritual Requests During Hospitalization: None Consults Spiritual Care Consult Needed: No Social Work Consult Needed: No Merchant navy officer (For Healthcare) Does Patient Have a Medical Advance Directive?: No    Additional Information 1:1 In Past 12 Months?: No CIRT Risk: No Elopement Risk: No Does patient have medical clearance?: Yes     Disposition:  Disposition Initial Assessment Completed for this Encounter: Yes Disposition of Patient: Other dispositions (Observation Unit per Donell Sievert, NP) Type of inpatient treatment program: Adult  Zenovia Jordan [facility] 12/25/2016 10:45 PM

## 2016-12-25 NOTE — ED Notes (Signed)
[facility]

## 2016-12-25 NOTE — ED Provider Notes (Signed)
[facility] DE[facility] Provider Note   CSN: [id number] Arrival date & time: 12/25/16  1802     History   Chief Complaint Chief Complaint  Patient presents with  . V70.1    HPI Kristin Humphrey is a 46 y.o. female.   Mental Health Problem  Presenting symptoms: agitation, depression and suicidal thoughts   Presenting symptoms: no suicidal threats and no suicide attempt   Degree of incapacity (severity):  Moderate Onset quality:  Gradual Duration:  2 weeks Timing:  Constant Progression:  Worsening Chronicity:  Recurrent Context: medication   Context: not alcohol use, not drug abuse, not noncompliant and not recent medication change   Treatment compliance:  All of the time Relieved by:  Nothing Worsened by:  Nothing Ineffective treatments:  Antidepressants and anti-anxiety medications   Past Medical History:  Diagnosis Date  . Anxiety   . Chronic pain   . Depression   . Diabetes mellitus without complication [facility](HCC)     Patient Active Problem List   Diagnosis Date Noted  . Bipolar I disorder, most recent episode depressed (HCC)   . Chronic back pain 03/05/2016  . Suicidal ideations 03/05/2016  . Major depressive disorder, single episode, severe without psychotic features (HCC)   . Severe recurrent major depression without psychotic features (HCC) 04/04/2015  . GAD (generalized anxiety disorder) 04/04/2015    Past Surgical History:  Procedure Laterality Date  . ABDOMINAL HYSTERECTOMY    . BUNIONECTOMY    . NECK SURGERY      OB History    No data available       Home Medications    Prior to Admission medications   Medication Sig Start Date End Date Taking? Authorizing Provider  baclofen (LIORESAL) 10 MG tablet Take 1 tablet (10 mg total) by mouth 2 (two) times daily. 03/08/16   Adonis BrookAgustin, Sheila, NP  divalproex (DEPAKOTE) 500 MG DR tablet Take 1 tablet (500 mg total) by mouth every 12 (twelve) hours. 03/08/16   Adonis BrookAgustin, Sheila, NP  DULoxetine (CYMBALTA) 60 MG  capsule Take 1 capsule (60 mg total) by mouth daily. 03/08/16   Adonis BrookAgustin, Sheila, NP  gabapentin (NEURONTIN) 400 MG capsule Take 2 capsules (800 mg total) by mouth 4 (four) times daily. 03/08/16   Adonis BrookAgustin, Sheila, NP  levothyroxine (SYNTHROID, LEVOTHROID) 100 MCG tablet Take 1 tablet (100 mcg total) by mouth daily before breakfast. 03/08/16   Adonis BrookAgustin, Sheila, NP  lidocaine (LIDODERM) 5 % Place 1 patch onto the skin daily. Remove & Discard patch within 12 hours or as directed by MD 03/08/16   Adonis BrookAgustin, Sheila, NP  lisinopril-hydrochlorothiazide (PRINZIDE,ZESTORETIC) 20-25 MG tablet Take 1 tablet by mouth daily. 03/08/16   Adonis BrookAgustin, Sheila, NP  nicotine (NICODERM CQ - DOSED IN MG/24 HOURS) 21 mg/24hr patch Place 1 patch (21 mg total) onto the skin daily. 03/08/16   Adonis BrookAgustin, Sheila, NP  pantoprazole (PROTONIX) 40 MG tablet Take 1 tablet (40 mg total) by mouth daily. 03/08/16   Adonis BrookAgustin, Sheila, NP  traZODone (DESYREL) 50 MG tablet Take 1 tablet (50 mg total) by mouth at bedtime. 03/08/16   Adonis BrookAgustin, Sheila, NP  Vitamin D, Ergocalciferol, (DRISDOL) 50000 units CAPS capsule Take 1 capsule (50,000 Units total) by mouth every 7 (seven) days. 03/08/16   Adonis BrookAgustin, Sheila, NP    Family History Family History  Problem Relation Age of Onset  . Heart failure Mother   . Diabetes Mother   . Cancer Father   . Cancer Other   . Diabetes Other  Social History Social History  Substance Use Topics  . Smoking status: Current Every Day Smoker    Packs/day: 0.50    Types: Cigarettes  . Smokeless tobacco: Never Used  . Alcohol use No     Allergies   Adhesive [tape]; Celebrex [celecoxib]; and Zomig [zolmitriptan]   Review of Systems Review of Systems  Psychiatric/Behavioral: Positive for agitation and suicidal ideas.  All other systems reviewed and are negative.    Physical Exam Updated Vital Signs BP (!) 133/99 (BP Location: Right Arm)   Pulse (!) 137 Comment: Patient tearful at triage.   Temp 98.1 F (36.7 C)  (Oral)   Resp 20   Ht 5\' 4"  (1.626 m)   Wt 92.1 kg (203 lb)   LMP 04/03/2015   SpO2 99%   BMI 34.84 kg/m   Physical Exam  Constitutional: She appears well-developed and well-nourished.  HENT:  Head: Normocephalic and atraumatic.  Eyes: Conjunctivae and EOM are normal.  Neck: Normal range of motion.  Cardiovascular: Tachycardia present.   Pulmonary/Chest: Effort normal and breath sounds normal. No respiratory distress. She has no wheezes.  Abdominal: Soft. She exhibits no distension. There is no tenderness.  Skin: Skin is warm and dry.  Wet hands  Nursing note and vitals reviewed.    ED Treatments / Results  Labs (all labs ordered are listed, but only abnormal results are displayed) Labs Reviewed  COMPREHENSIVE METABOLIC PANEL - Abnormal; Notable for the following:       Result Value   Chloride 97 (*)    Glucose, Bld 279 (*)    BUN <5 (*)    All other components within normal limits  ACETAMINOPHEN LEVEL - Abnormal; Notable for the following:    Acetaminophen (Tylenol), Serum <10 (*)    All other components within normal limits  CBC - Abnormal; Notable for the following:    WBC 14.5 (*)    RBC 5.62 (*)    HCT 47.1 (*)    All other components within normal limits  RAPID URINE DRUG SCREEN, HOSP PERFORMED - Abnormal; Notable for the following:    Benzodiazepines POSITIVE (*)    All other components within normal limits  ETHANOL  SALICYLATE LEVEL    EKG  EKG Interpretation  Date/Time:  Monday Dec 25 2016 19:21:55 EDT Ventricular Rate:  103 PR Interval:  204 QRS Duration: 92 QT Interval:  346 QTC Calculation: 453 R Axis:   93 Text Interpretation:  Sinus tachycardia Rightward axis Low voltage QRS Cannot rule out Anterior infarct , age undetermined Abnormal ECG No significant change since last tracing since July 2017 Confirmed by Marily Memos [phone]) on 12/25/2016 8:42:00 PM       Radiology No results found.  Procedures Procedures (including critical care  time)  Medications Ordered in ED Medications  baclofen (LIORESAL) tablet 10 mg (not administered)  divalproex (DEPAKOTE) DR tablet 500 mg (not administered)  DULoxetine (CYMBALTA) DR capsule 60 mg (not administered)  gabapentin (NEURONTIN) capsule 800 mg (not administered)  levothyroxine (SYNTHROID, LEVOTHROID) tablet 100 mcg (not administered)  nicotine (NICODERM CQ - dosed in mg/24 hours) patch 21 mg (not administered)  pantoprazole (PROTONIX) EC tablet 40 mg (not administered)  traZODone (DESYREL) tablet 50 mg (not administered)  LORazepam (ATIVAN) tablet 1 mg (not administered)  lisinopril (PRINIVIL,ZESTRIL) tablet 20 mg (not administered)  hydrochlorothiazide (HYDRODIURIL) tablet 25 mg (not administered)     Initial Impression / Assessment and Plan / ED Course  I have reviewed the triage vital signs and  the nursing notes.  Pertinent labs & imaging results that were available during my care of the patient were reviewed by me and considered in my medical decision making (see chart for details).    HR likely up 2/2 severe anxiety, but will get ecg and labs to workup more appropriately. Will also give anxiety medications and order home medications. Will wait for improved VS and labs for medical clearance.  HR improved, likely anxiety. Stable for tts consult. Medically cleared for same.   Final Clinical Impressions(s) / ED Diagnoses   Final diagnoses:  Anxiety  Suicidal ideations     Kristene Liberati, Barbara Cower, MD 12/25/16 2046

## 2016-12-25 NOTE — ED Triage Notes (Signed)
Patient complaining of suicidal ideation since Friday. States "I have a plan but I'm not going to tell anybody what it is. I have chronic pain and I know it's not going to get any better and I just think it would be better on everybody else if I just wasn't here."

## 2016-12-25 NOTE — ED Notes (Addendum)
Pt states she takes lantus and Insulin to control her DM. She last took it this morning. She sates the pharmacy tech has her dosages

## 2016-12-26 ENCOUNTER — Encounter (HOSPITAL_COMMUNITY): Payer: Self-pay

## 2016-12-26 ENCOUNTER — Observation Stay (HOSPITAL_COMMUNITY)
Admission: AD | Admit: 2016-12-26 | Discharge: 2016-12-27 | Disposition: A | Discharge status: Home / Self Care | Payer: Medicaid - Out of State | Source: Intra-hospital | Attending: Psychiatry | Admitting: Psychiatry

## 2016-12-26 DIAGNOSIS — Z794 Long term (current) use of insulin: Secondary | ICD-10-CM | POA: Diagnosis not present

## 2016-12-26 DIAGNOSIS — E119 Type 2 diabetes mellitus without complications: Secondary | ICD-10-CM | POA: Insufficient documentation

## 2016-12-26 DIAGNOSIS — F411 Generalized anxiety disorder: Secondary | ICD-10-CM | POA: Insufficient documentation

## 2016-12-26 DIAGNOSIS — F332 Major depressive disorder, recurrent severe without psychotic features: Principal | ICD-10-CM | POA: Diagnosis present

## 2016-12-26 DIAGNOSIS — Z888 Allergy status to other drugs, medicaments and biological substances status: Secondary | ICD-10-CM | POA: Diagnosis not present

## 2016-12-26 DIAGNOSIS — R45851 Suicidal ideations: Secondary | ICD-10-CM | POA: Diagnosis not present

## 2016-12-26 DIAGNOSIS — Z91048 Other nonmedicinal substance allergy status: Secondary | ICD-10-CM | POA: Diagnosis not present

## 2016-12-26 DIAGNOSIS — Z79899 Other long term (current) drug therapy: Secondary | ICD-10-CM | POA: Diagnosis not present

## 2016-12-26 LAB — GLUCOSE, CAPILLARY
Glucose-Capillary: 204 mg/dL — ABNORMAL HIGH (ref 65–99)
Glucose-Capillary: 186 mg/dL — ABNORMAL HIGH (ref 65–99)
Glucose-Capillary: 231 mg/dL — ABNORMAL HIGH (ref 65–99)
Glucose-Capillary: 181 mg/dL — ABNORMAL HIGH (ref 65–99)

## 2016-12-26 MED ORDER — ALUM & MAG HYDROXIDE-SIMETH 200-200-20 MG/5ML PO SUSP: 30.0000 mL | ORAL | Status: DC | PRN

## 2016-12-26 MED ORDER — MAGNESIUM HYDROXIDE 400 MG/5ML PO SUSP: 30.0000 mL | Freq: Every day | ORAL | Status: DC | PRN

## 2016-12-26 MED ORDER — PANTOPRAZOLE SODIUM 40 MG PO TBEC
40.0000 mg | DELAYED_RELEASE_TABLET | Freq: Every day | ORAL | Status: DC
  Administered 2016-12-26 – 2016-12-27 (×2): 40 mg via ORAL
  Filled 2016-12-26 (×2): qty 1

## 2016-12-26 MED ORDER — DIVALPROEX SODIUM 500 MG PO DR TAB
500.0000 mg | DELAYED_RELEASE_TABLET | Freq: Two times a day (BID) | ORAL | Status: DC
  Administered 2016-12-26 – 2016-12-27 (×3): 500 mg via ORAL
  Filled 2016-12-26 (×3): qty 1

## 2016-12-26 MED ORDER — DULOXETINE HCL 60 MG PO CPEP
60.0000 mg | ORAL_CAPSULE | Freq: Every day | ORAL | Status: DC
  Administered 2016-12-26 – 2016-12-27 (×2): 60 mg via ORAL
  Filled 2016-12-26 (×2): qty 1

## 2016-12-26 MED ORDER — GABAPENTIN 400 MG PO CAPS
800.0000 mg | ORAL_CAPSULE | Freq: Four times a day (QID) | ORAL | Status: DC
  Administered 2016-12-26 – 2016-12-27 (×7): 800 mg via ORAL
  Filled 2016-12-26 (×7): qty 2

## 2016-12-26 MED ORDER — INSULIN ASPART 100 UNIT/ML ~~LOC~~ SOLN: 0.0000 [IU] | Freq: Every day | SUBCUTANEOUS | Status: DC

## 2016-12-26 MED ORDER — NICOTINE 21 MG/24HR TD PT24
21.0000 mg | MEDICATED_PATCH | Freq: Every day | TRANSDERMAL | Status: DC
  Administered 2016-12-26 – 2016-12-27 (×2): 21 mg via TRANSDERMAL
  Filled 2016-12-26 (×2): qty 1

## 2016-12-26 MED ORDER — INSULIN ASPART 100 UNIT/ML ~~LOC~~ SOLN
0.0000 [IU] | Freq: Three times a day (TID) | SUBCUTANEOUS | Status: DC
  Administered 2016-12-26: 4 [IU] via SUBCUTANEOUS
  Administered 2016-12-26 (×2): 7 [IU] via SUBCUTANEOUS
  Administered 2016-12-27: 11 [IU] via SUBCUTANEOUS
  Administered 2016-12-27: 4 [IU] via SUBCUTANEOUS

## 2016-12-26 MED ORDER — LEVOTHYROXINE SODIUM 125 MCG PO TABS
125.0000 ug | ORAL_TABLET | Freq: Every day | ORAL | Status: DC
  Administered 2016-12-26 – 2016-12-27 (×2): 125 ug via ORAL
  Filled 2016-12-26 (×2): qty 1

## 2016-12-26 MED ORDER — CLONAZEPAM 0.5 MG PO TABS
0.5000 mg | ORAL_TABLET | Freq: Four times a day (QID) | ORAL | Status: DC
  Administered 2016-12-26 – 2016-12-27 (×7): 0.5 mg via ORAL
  Filled 2016-12-26 (×7): qty 1

## 2016-12-26 MED ORDER — ACETAMINOPHEN 325 MG PO TABS
650.0000 mg | ORAL_TABLET | Freq: Four times a day (QID) | ORAL | Status: DC | PRN
  Administered 2016-12-26 – 2016-12-27 (×2): 650 mg via ORAL
  Filled 2016-12-26 (×2): qty 2

## 2016-12-26 MED ORDER — INSULIN GLARGINE 100 UNIT/ML ~~LOC~~ SOLN
25.0000 [IU] | Freq: Every day | SUBCUTANEOUS | Status: DC
  Administered 2016-12-26: 25 [IU] via SUBCUTANEOUS

## 2016-12-26 MED ORDER — TRAZODONE HCL 50 MG PO TABS
50.0000 mg | ORAL_TABLET | Freq: Every evening | ORAL | Status: DC | PRN
  Administered 2016-12-26: 50 mg via ORAL
  Filled 2016-12-26: qty 1

## 2016-12-26 MED ORDER — LIDOCAINE 5 % EX PTCH
1.0000 | MEDICATED_PATCH | Freq: Every day | CUTANEOUS | Status: DC | PRN
  Filled 2016-12-26: qty 1

## 2016-12-26 NOTE — ED Notes (Signed)
Pt ambulated off unit with [facility] transport staff along with all paperwork and pt belongings

## 2016-12-26 NOTE — H&P (Signed)
BH Observation Unit Provider Admission PAA/H&P  Patient Identification: Kristin FeilMary A Humphrey MRN:  [id number] Date of Evaluation:  12/26/2016 Chief Complaint:  MDD Principal Diagnosis: MDD Diagnosis:   Patient Active Problem List   Diagnosis Date Noted  . MDD (major depressive disorder), recurrent episode, severe (HCC) [F33.2] 12/26/2016  . Bipolar I disorder, most recent episode depressed (HCC) [F31.30]   . Chronic back pain [M54.9, G89.29] 03/05/2016  . Suicidal ideations [R45.851] 03/05/2016  . Major depressive disorder, single episode, severe without psychotic features (HCC) [F32.2]   . Severe recurrent major depression without psychotic features (HCC) [F33.2] 04/04/2015  . GAD (generalized anxiety disorder) [F41.1] 04/04/2015   History of Present Illness: Patient is a 46 y/[facility] due to worsening depressive symptoms culminating in SI with intent,plan and means due to declining health concerns and chronic pain. Associated Signs/Symptoms: Depression Symptoms:  depressed mood, suicidal thoughts with specific plan, (Hypo) Manic Symptoms:  n/a Anxiety Symptoms:  Excessive Worry, Psychotic Symptoms:  n/a PTSD Symptoms: Negative Total Time spent with patient: 20 minutes  Past Psychiatric History: MDD  Is the patient at risk to self? Yes.    Has the patient been a risk to self in the past 6 months? Yes.    Has the patient been a risk to self within the distant past? Yes.    Is the patient a risk to others? No.  Has the patient been a risk to others in the past 6 months? No.  Has the patient been a risk to others within the distant past? No.   Prior Inpatient Therapy:  yes Prior Outpatient Therapy:  yes  Alcohol Screening:  yes Substance Abuse History in the last 12 months:  No. Consequences of Substance Abuse: Medical Consequences:  Hx alcohol abuse Previous Psychotropic Medications: Yes  Psychological Evaluations: Yes  Past Medical History:   Past Medical History:  Diagnosis Date  . Anxiety   . Chronic pain   . Depression   . Diabetes mellitus without complication [facility](HCC)     Past Surgical History:  Procedure Laterality Date  . ABDOMINAL HYSTERECTOMY    . BUNIONECTOMY    . NECK SURGERY     Family History:  Family History  Problem Relation Age of Onset  . Heart failure Mother   . Diabetes Mother   . Cancer Father   . Cancer Other   . Diabetes Other    Family Psychiatric History: unknown Tobacco Screening:   Social History:  History  Alcohol Use No     History  Drug Use No    Additional Social History:                           Allergies:   Allergies  Allergen Reactions  . Adhesive [Tape] Other (See Comments)    Causes blisters and redness. Prefers paper tape   . Celebrex [Celecoxib] Other (See Comments)    Pt reports is makes her"hurt worse"  . Statins     seizures  . Zomig [Zolmitriptan] Other (See Comments)    Chest tightness   Lab Results:  Results for orders placed or performed during the hospital encounter of 12/26/16 (from the past 48 hour(s))  Glucose, capillary     Status: Abnormal   Collection Time: 12/26/16  4:01 AM  Result Value Ref Range   Glucose-Capillary 204 (H) 65 - 99 mg/dL    Blood Alcohol level:  Lab Results  Component Value  Date   ETH <5 12/25/2016   ETH <5 03/04/2016    Metabolic Disorder Labs:  Lab Results  Component Value Date   HGBA1C 9.5 (H) 03/06/2016   MPG 226 03/06/2016   Lab Results  Component Value Date   PROLACTIN 22.9 03/06/2016   Lab Results  Component Value Date   CHOL 281 (H) 03/06/2016   TRIG 244 (H) 03/06/2016   HDL 51 03/06/2016   CHOLHDL 5.5 03/06/2016   VLDL 49 (H) 03/06/2016   LDLCALC 181 (H) 03/06/2016    Current Medications: Current Facility-Administered Medications  Medication Dose Route Frequency Provider Last Rate Last Dose  . acetaminophen (TYLENOL) tablet 650 mg  650 mg Oral Q6H PRN Kerry Hough, PA-C      .  alum & mag hydroxide-simeth (MAALOX/MYLANTA) 200-200-20 MG/5ML suspension 30 mL  30 mL Oral Q4H PRN Kerry Hough, PA-C      . clonazePAM Scarlette Calico) tablet 0.5 mg  0.5 mg Oral QID Donell Sievert E, PA-C      . divalproex (DEPAKOTE) DR tablet 500 mg  500 mg Oral Q12H Myana Schlup E, PA-C      . DULoxetine (CYMBALTA) DR capsule 60 mg  60 mg Oral Daily Yvana Samonte E, PA-C      . gabapentin (NEURONTIN) capsule 800 mg  800 mg Oral QID Zacaria Pousson E, PA-C      . insulin aspart (novoLOG) injection 0-20 Units  0-20 Units Subcutaneous TID WC Nikiesha Milford E, PA-C      . insulin aspart (novoLOG) injection 0-5 Units  0-5 Units Subcutaneous QHS Dejha King E, PA-C      . insulin glargine (LANTUS) injection 25 Units  25 Units Subcutaneous QHS Sylvana Bonk E, PA-C      . levothyroxine (SYNTHROID, LEVOTHROID) tablet 125 mcg  125 mcg Oral QAC breakfast Donell Sievert E, PA-C      . lidocaine (LIDODERM) 5 % 1 patch  1 patch Transdermal Daily PRN Donell Sievert E, PA-C      . magnesium hydroxide (MILK OF MAGNESIA) suspension 30 mL  30 mL Oral Daily PRN Donell Sievert E, PA-C      . nicotine (NICODERM CQ - dosed in mg/24 hours) patch 21 mg  21 mg Transdermal Daily Elijah Michaelis E, PA-C      . pantoprazole (PROTONIX) EC tablet 40 mg  40 mg Oral Daily Emersen Carroll E, PA-C      . traZODone (DESYREL) tablet 50 mg  50 mg Oral QHS PRN Kerry Hough, PA-C       PTA Medications: Prescriptions Prior to Admission  Medication Sig Dispense Refill Last Dose  . clonazePAM (KLONOPIN) 0.5 MG tablet Take 0.5 mg by mouth 4 (four) times daily.   12/25/2016 at Unknown time  . divalproex (DEPAKOTE) 500 MG DR tablet Take 1 tablet (500 mg total) by mouth every 12 (twelve) hours. 60 tablet 0 12/25/2016 at 930a  . DULoxetine (CYMBALTA) 60 MG capsule Take 1 capsule (60 mg total) by mouth daily. 30 capsule 0 12/25/2016 at Unknown time  . gabapentin (NEURONTIN) 400 MG capsule Take 2 capsules (800 mg total) by mouth 4  (four) times daily. 60 capsule 0 12/25/2016 at Unknown time  . insulin glargine (LANTUS) 100 UNIT/ML injection Inject 25 Units into the skin at bedtime.   12/24/2016 at Unknown time  . insulin NPH-regular Human (NOVOLIN 70/30) (70-30) 100 UNIT/ML injection Inject 45 Units into the skin every morning.   12/25/2016 at Unknown time  .  levothyroxine (SYNTHROID, LEVOTHROID) 125 MCG tablet Take 125 mcg by mouth daily before breakfast.   12/25/2016 at Unknown time  . lidocaine (LIDODERM) 5 % Place 1 patch onto the skin daily. Remove & Discard patch within 12 hours or as directed by MD (Patient taking differently: Place 1 patch onto the skin daily as needed (for pain). Remove & Discard patch within 12 hours or as directed by MD) 30 patch 0 Past Week at Unknown time  . methocarbamol (ROBAXIN) 500 MG tablet Take 500 mg by mouth 3 (three) times daily.   12/25/2016 at Unknown time  . nicotine (NICODERM CQ - DOSED IN MG/24 HOURS) 21 mg/24hr patch Place 1 patch (21 mg total) onto the skin daily. 28 patch 0 unknown  . omeprazole (PRILOSEC) 40 MG capsule Take 40 mg by mouth every morning.   12/25/2016 at Unknown time  . oxyCODONE-acetaminophen (PERCOCET) 7.5-325 MG tablet Take 1 tablet by mouth 4 (four) times daily.   12/25/2016 at Unknown time  . traZODone (DESYREL) 50 MG tablet Take 1 tablet (50 mg total) by mouth at bedtime. (Patient taking differently: Take 50 mg by mouth at bedtime as needed. ) 30 tablet 0 12/23/2016  . Vitamin D, Ergocalciferol, (DRISDOL) 50000 units CAPS capsule Take 1 capsule (50,000 Units total) by mouth every 7 (seven) days. (Patient taking differently: Take 50,000 Units by mouth every Sunday. ) 30 capsule 0 12/24/2016 at Unknown time    Musculoskeletal: Strength & Muscle Tone: within normal limits Gait & Station: normal Patient leans: N/A  Psychiatric Specialty Exam: Physical Exam  Constitutional: She is oriented to person, place, and time. She appears well-developed and well-nourished. No  distress.  HENT:  Head: Normocephalic.  Eyes: Pupils are equal, round, and reactive to light.  Respiratory: Effort normal and breath sounds normal.  Neurological: She is alert and oriented to person, place, and time. No cranial nerve deficit.  Skin: Skin is warm and dry. She is not diaphoretic.  Psychiatric: Her speech is normal and behavior is normal. Judgment and thought content normal. Her mood appears anxious. Cognition and memory are normal. She exhibits a depressed mood.    Review of Systems  Psychiatric/Behavioral: Positive for depression and suicidal ideas. Negative for substance abuse. The patient is nervous/anxious.   All other systems reviewed and are negative.   Last menstrual period 04/03/2015.There is no height or weight on file to calculate BMI.  General Appearance: Casual  Eye Contact:  Fair  Speech:  Clear and Coherent  Volume:  Normal  Mood:  Depressed  Affect:  Congruent  Thought Process:  Goal Directed  Orientation:  Full (Time, Place, and Person)  Thought Content:  Negative  Suicidal Thoughts:  Yes.  with intent/plan  Homicidal Thoughts:  No  Memory:  Immediate;   Good  Judgement:  Fair  Insight:  Fair  Psychomotor Activity:  Negative  Concentration:  Concentration: Fair  Recall:  Fair  Fund of Knowledge:  Fair  Language:  Good  Akathisia:  Negative  Handed:  Right  AIMS (if indicated):     Assets:  Desire for Improvement  ADL's:  Intact  Cognition:  WNL  Sleep:         Treatment Plan Summary: Daily contact with patient to assess and evaluate symptoms and progress in treatment  Observation Level/Precautions:  Continuous Observation Laboratory:   Psychotherapy:   Medications:   Consultations:   Discharge Concerns:   Estimated LOS: Other:      Kerry Hough, PA-C 5/22/20184:40  AM

## 2016-12-26 NOTE — Progress Notes (Signed)
Pt in bed most of shift.  Pt up to take meds and for assessment.  Pt ambulates to restroom and to get snacks.  Pt wanting to go home "this is not helping me".   Pt monitored for safety and offered support and encouragement. Pt returns to bed to sleep. Pt remains safe on unit.

## 2016-12-26 NOTE — ED Notes (Signed)
[facility]

## 2016-12-26 NOTE — Progress Notes (Signed)
ADMISSION NOTE:Kristin Humphrey is an 46 y.o. female who voluntarily reported the OBS bed 5 due to SI with intent and a plan.  Pt denies HI and AVH.  Pt has a history of suicidal gestures and sts she attempted two times before.  Pt sts she has chronic pain suffered during a work related injury. Pt sts due to the pain and inability to enjoy life she has no desire to live.  Pt endorses sadness, guilt, isolation, loss of pleasure of activities.  Pt sts she is medication compliant and sees a psychiatrist.  Additionally, Pt sts she sees a therapist and is in the process of getting another therapist. Pt uses a cane and has had a fall in the recent past d/t balance problems.  Pt is HIGH FALL RISK.  Pt sts she is in pain and has plan to have back surgery in the near future.  Pt verbally contracts for safety.Pt is 1.5 pack a day smoker and is diabetic.  BS at admission was 205. Pt sts she lives alone and can return to her residence. Pt sts she does have a significant other who comes to see her during the week.  However, due to the pain, Pt sts she is depressed. Pt sts she has a history of alcohol abuse.  However, Kristin DandyMary denies current abuse of alcohol and illicit drugs.  Pt continuously observed for safety while on unit except when in bathroom.  Pt remains safe.

## 2016-12-26 NOTE — Progress Notes (Signed)
D:Pt has been sleeping this morning. She woke up and took  her medications and went to the restroom. Pt requested pain medication "that I take at home." Pt was offered a prn lidocaine patch and tylenol. Pt refused saying that the medication that writer offered does not help her and saying that she should have brought her own medication to the hospital. Pt then went to her bed and fell back asleep. A:Offered support and observation. R:Safety maintained on the unit.
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
## 2016-12-26 NOTE — ED Notes (Signed)
[facility]

## 2016-12-27 DIAGNOSIS — F17210 Nicotine dependence, cigarettes, uncomplicated: Secondary | ICD-10-CM | POA: Diagnosis not present

## 2016-12-27 DIAGNOSIS — F332 Major depressive disorder, recurrent severe without psychotic features: Secondary | ICD-10-CM | POA: Diagnosis not present

## 2016-12-27 DIAGNOSIS — R45851 Suicidal ideations: Secondary | ICD-10-CM | POA: Diagnosis not present

## 2016-12-27 LAB — GLUCOSE, CAPILLARY
GLUCOSE-CAPILLARY: 194 mg/dL — AB (ref 65–99)
GLUCOSE-CAPILLARY: 255 mg/dL — AB (ref 65–99)

## 2016-12-27 MED ORDER — NICOTINE 21 MG/24HR TD PT24: 21.0000 mg | MEDICATED_PATCH | Freq: Every day | TRANSDERMAL | 0 refills | Status: DC

## 2016-12-27 MED ORDER — CLONAZEPAM 0.5 MG PO TABS: 0.5000 mg | ORAL_TABLET | Freq: Four times a day (QID) | ORAL | 0 refills | Status: DC

## 2016-12-27 MED ORDER — TRAZODONE HCL 50 MG PO TABS: 50.0000 mg | ORAL_TABLET | Freq: Every evening | ORAL | 0 refills | Status: DC | PRN

## 2016-12-27 NOTE — Progress Notes (Signed)
Patient was discharged per order. AVS, medications, and follow-up appointments were all reviewed with patient. Pt was given an opportunity to ask questions and verbalized understanding of all discharge paperwork. Belongings were returned, and patient signed for receipt. Patient verbalized readiness for discharge and appeared in no acute distress when escorted outside by [facility], where boyfriend awaited to drive her home.

## 2016-12-27 NOTE — [facility] Counselor (Signed)
Pt will be discharged on today after 4pm and is expected to [place] [facility] [place] [facility] [place] This pt has a scheduled appointment on Thursday, Jan 04, 2017 with Dr. Valene BorsWilliam Trosta.

## 2016-12-27 NOTE — Progress Notes (Signed)
D: Kristin Humphrey denied SI, HI, and AVH. She said she's ready to go home. She complained of back pain unrelieved by Tylenol. She has been observed ambulating to the restroom with a cane. Her gait is fairly steady, but she is still deemed a high fall risk. She took medications without issue and has been polite and cooperative.   A: Meds given as ordered. Q15 safety checks maintained. Support/encouragement offered.  R: Pt remains free from harm and continues with treatment. Will continue to monitor for needs/safety.

## 2016-12-27 NOTE — Discharge Summary (Signed)
Physician Discharge Summary Note  Patient:  Kristin Humphrey is an 46 y.o., female MRN:  [id number] DOB:  [birth year] Patient phone:  [phone] (home)  Patient address:   [address] [place] [address],  Total Time spent with patient: 30 minutes  Date of Admission:  12/26/2016 Date of Discharge: 12/27/2016  Reason for Admission:   Principal Problem: MDD (major depressive disorder), recurrent episode, severe [facility])  Discharge Diagnoses: Patient Active Problem List   Diagnosis Date Noted  . MDD (major depressive disorder), recurrent episode, severe (HCC) [F33.2] 12/26/2016  . Bipolar I disorder, most recent episode depressed (HCC) [F31.30]   . Chronic back pain [M54.9, G89.29] 03/05/2016  . Suicidal ideations [R45.851] 03/05/2016  . Major depressive disorder, single episode, severe without psychotic features (HCC) [F32.2]   . Severe recurrent major depression without psychotic features (HCC) [F33.2] 04/04/2015  . GAD (generalized anxiety disorder) [F41.1] 04/04/2015    Past Psychiatric History: MDD, Bipolar 1, Suicidal ideations, GAD  Past Medical History:  Past Medical History:  Diagnosis Date  . Anxiety   . Chronic pain   . Depression   . Diabetes mellitus without complication [facility])     Past Surgical History:  Procedure Laterality Date  . ABDOMINAL HYSTERECTOMY    . BUNIONECTOMY    . NECK SURGERY     Family History:  Family History  Problem Relation Age of Onset  . Heart failure Mother   . Diabetes Mother   . Cancer Father   . Cancer Other   . Diabetes Other    Family Psychiatric  History: Unknown Social History:  History  Alcohol Use No     History  Drug Use No    Social History   Social History  . Marital status: Single    Spouse name: N/A  . Number of children: N/A  . Years of education: N/A   Social History Main Topics  . Smoking status: Current Every Day Smoker    Packs/day: 0.50    Types: Cigarettes  . Smokeless tobacco: Never Used      Comment: refused  . Alcohol use No  . Drug use: No  . Sexual activity: No   Other Topics Concern  . None   Social History Narrative  . None    Hospital Course:  Kristin Humphrey is a 46 year old female who presented to the [facility] OBS unit with suicidal ideation with a plan and means due to worsening depression and chronic pain from a work related injury. Pt spent the night in the OBS unit without incident and was appropriate in her interactions with staff members and other patients. Pt stated she lives in [place], [place] and has an appointment with her mental health provider, Dr Wilmon Arms on  Thursday May 31st. Today,  Pt denies suicidal/homicidal ideation, denies auditory/visual hallucinations and does not appear to be responding to internal stimuli. Pt stated she was able to contract for safety upon discharge and just had a melt down out in public due to her agoraphobia. Pt was calm and cooperative, alert and oriented, smiling and ready to go home. Pt is stable for discharge.   Physical Findings: AIMS: Facial and Oral Movements Muscles of Facial Expression: None, normal Lips and Perioral Area: None, normal Jaw: None, normal Tongue: None, normal,Extremity Movements Upper (arms, wrists, hands, fingers): None, normal Lower (legs, knees, ankles, toes): None, normal, Trunk Movements Neck, shoulders, hips: None, normal, Overall Severity Severity of abnormal movements (highest score from questions above): None,  normal Incapacitation due to abnormal movements: None, normal Patient's awareness of abnormal movements (rate only patient's report): No Awareness, Dental Status Current problems with teeth and/or dentures?: No Does patient usually wear dentures?: Yes  CIWA:    COWS:     Musculoskeletal: Strength & Muscle Tone: within normal limits Gait & Station: normal Patient leans: N/A  Psychiatric Specialty Exam: Physical Exam  Constitutional: She is oriented to person, place, and time. She  appears well-developed and well-nourished.  HENT:  Head: Normocephalic.  Neurological: She is alert and oriented to person, place, and time.  Skin: Skin is warm and dry.    Review of Systems  Psychiatric/Behavioral: Positive for depression and suicidal ideas. Negative for hallucinations, memory loss and substance abuse. The patient is not nervous/anxious and does not have insomnia.     Blood pressure (!) 98/56, pulse 69, temperature 98.1 F (36.7 C), temperature source Oral, resp. rate 18, height 5\' 4"  (1.626 m), weight 92.1 kg (203 lb 0.7 oz), last menstrual period 04/03/2015, SpO2 100 %.Body mass index is 34.85 kg/m.  General Appearance: Casual and Fairly Groomed  Eye Contact:  Good  Speech:  Clear and Coherent and Normal Rate  Volume:  Normal  Mood:  Anxious and Depressed  Affect:  Congruent and Depressed  Thought Process:  Coherent, Goal Directed and Linear  Orientation:  Full (Time, Place, and Person)  Thought Content:  Logical  Suicidal Thoughts:  Yes.  with intent/plan, denies today  Homicidal Thoughts:  No  Memory:  Immediate;   Good Recent;   Good Remote;   Fair  Judgement:  Fair  Insight:  Fair  Psychomotor Activity:  Normal  Concentration:  Concentration: Good and Attention Span: Good  Recall:  Good  Fund of Knowledge:  Good  Language:  Good  Akathisia:  No  Handed:  Right  AIMS (if indicated):     Assets:  Communication Skills Desire for Improvement Financial Resources/Insurance Resilience Social Support Transportation Vocational/Educational  ADL's:  Intact  Cognition:  WNL  Sleep:           Has this patient used any form of tobacco in the last 30 days? (Cigarettes, Smokeless Tobacco, Cigars, and/or Pipes) Yes, Yes, A prescription for an FDA-approved tobacco cessation medication was offered at discharge and the patient refused  Blood Alcohol level:  Lab Results  Component Value Date   [facility] 12/25[facility]2016   ETH <5 03/04/2016    Metabolic Disorder  Labs:  Lab Results  Component Value Date   HGBA1C 9.5 (H) 03/06/2016   MPG 226 03/06/2016   Lab Results  Component Value Date   PROLACTIN 22.9 03/06/2016   Lab Results  Component Value Date   CHOL 281 (H) 03/06/2016   TRIG 244 (H) 03/06/2016   HDL 51 03/06/2016   CHOLHDL 5.5 03/06/2016   VLDL 49 (H) 03/06/2016   LDLCALC 181 (H) 03/06/2016    See Psychiatric Specialty Exam and Suicide Risk Assessment completed by Attending Physician prior to discharge.  Discharge destination:  Home  Is patient on multiple antipsychotic therapies at discharge:  No   Has Patient had three or more failed trials of antipsychotic monotherapy by history:  No  Recommended Plan for Multiple Antipsychotic Therapies: NA   Allergies as of 12/27/2016      Reactions   Adhesive [tape] Other (See Comments)   Causes blisters and redness. Prefers paper tape   Celebrex [celecoxib] Other (See Comments)   Pt reports is makes her"hurt worse"   Statins  seizures   Zomig [zolmitriptan] Other (See Comments)   Chest tightness      Medication List    STOP taking these medications   oxyCODONE-acetaminophen 7.5-325 MG tablet Commonly known as:  PERCOCET   Vitamin D (Ergocalciferol) 50000 units Caps capsule Commonly known as:  DRISDOL     TAKE these medications     Indication  clonazePAM 0.5 MG tablet Commonly known as:  KLONOPIN Take 1 tablet (0.5 mg total) by mouth 4 (four) times daily.  Indication:  Panic Disorder   divalproex 500 MG DR tablet Commonly known as:  DEPAKOTE Take 1 tablet (500 mg total) by mouth every 12 (twelve) hours.  Indication:  Mood stabilization   DULoxetine 60 MG capsule Commonly known as:  CYMBALTA Take 1 capsule (60 mg total) by mouth daily.  Indication:  Major Depressive Disorder   gabapentin 400 MG capsule Commonly known as:  NEURONTIN Take 2 capsules (800 mg total) by mouth 4 (four) times daily.  Indication:  Neuropathic Pain   insulin glargine 100 UNIT/ML  injection Commonly known as:  LANTUS Inject 25 Units into the skin at bedtime.  Indication:  Insulin-Dependent Diabetes   insulin NPH-regular Human (70-30) 100 UNIT/ML injection Commonly known as:  NOVOLIN 70/30 Inject 45 Units into the skin every morning.  Indication:  Insulin-Dependent Diabetes   levothyroxine 125 MCG tablet Commonly known as:  SYNTHROID, LEVOTHROID Take 125 mcg by mouth daily before breakfast.  Indication:  Underactive Thyroid   lidocaine 5 % Commonly known as:  LIDODERM Place 1 patch onto the skin daily. Remove & Discard patch within 12 hours or as directed by MD What changed:  when to take this  reasons to take this  additional instructions  Indication:  Allodynia   methocarbamol 500 MG tablet Commonly known as:  ROBAXIN Take 500 mg by mouth 3 (three) times daily.  Indication:  Musculoskeletal Pain   nicotine 21 mg/24hr patch Commonly known as:  NICODERM CQ - dosed in mg/24 hours Place 1 patch (21 mg total) onto the skin daily. Start taking on:  12/28/2016  Indication:  Nicotine Addiction   omeprazole 40 MG capsule Commonly known as:  PRILOSEC Take 40 mg by mouth every morning.  Indication:  Gastroesophageal Reflux Disease   traZODone 50 MG tablet Commonly known as:  DESYREL Take 1 tablet (50 mg total) by mouth at bedtime as needed for sleep. What changed:  when to take this  reasons to take this  Indication:  Trouble Sleeping      Follow-up Information    [facility]. Go on 01/04/2017.   Why:  Follow Up w/ Dr. Valene Bors @ 10:00am Contact information: [address] [place], [place] [address]  [phone]            Follow-up recommendations:  Activity:  as tolerated Diet:  carb modified, diabetic Other:  Follow up with Dr Wilmon Arms at mental health on May 31st  Comments:  Discharge home Take all medications as prescribed Avoid the use of alcohol or drugs Keep all follow up appts Follow up  with PCP for any new or existing medical needs.   Signed: Laveda Abbe, NP 12/27/2016, 2:56 PM

## 2019-05-08 ENCOUNTER — Encounter (HOSPITAL_COMMUNITY): Payer: Self-pay | Admitting: Emergency Medicine

## 2019-05-08 ENCOUNTER — Emergency Department (HOSPITAL_COMMUNITY)
Admission: EM | Admit: 2019-05-08 | Discharge: 2019-05-09 | Disposition: A | Discharge status: Home / Self Care | Payer: Medicaid - Out of State | Attending: Emergency Medicine | Admitting: Emergency Medicine

## 2019-05-08 ENCOUNTER — Other Ambulatory Visit: Payer: Self-pay

## 2019-05-08 DIAGNOSIS — R45851 Suicidal ideations: Secondary | ICD-10-CM | POA: Insufficient documentation

## 2019-05-08 DIAGNOSIS — F332 Major depressive disorder, recurrent severe without psychotic features: Secondary | ICD-10-CM | POA: Diagnosis not present

## 2019-05-08 DIAGNOSIS — Z20828 Contact with and (suspected) exposure to other viral communicable diseases: Secondary | ICD-10-CM | POA: Insufficient documentation

## 2019-05-08 DIAGNOSIS — F17210 Nicotine dependence, cigarettes, uncomplicated: Secondary | ICD-10-CM | POA: Diagnosis not present

## 2019-05-08 DIAGNOSIS — F329 Major depressive disorder, single episode, unspecified: Secondary | ICD-10-CM | POA: Diagnosis present

## 2019-05-08 DIAGNOSIS — E119 Type 2 diabetes mellitus without complications: Secondary | ICD-10-CM | POA: Diagnosis not present

## 2019-05-08 LAB — CBC
HCT: 45.6 % (ref 36.0–46.0)
Hemoglobin: 13.9 g/dL (ref 12.0–15.0)
MCH: 26.5 pg (ref 26.0–34.0)
MCHC: 30.5 g/dL (ref 30.0–36.0)
MCV: 87 fL (ref 80.0–100.0)
Platelets: 252 10*3/uL (ref 150–400)
RBC: 5.24 MIL/uL — ABNORMAL HIGH (ref 3.87–5.11)
RDW: 16 % — ABNORMAL HIGH (ref 11.5–15.5)
WBC: 15 10*3/uL — ABNORMAL HIGH (ref 4.0–10.5)
nRBC: 0 % (ref 0.0–0.2)

## 2019-05-08 LAB — RAPID URINE DRUG SCREEN, HOSP PERFORMED
Amphetamines: NOT DETECTED
Barbiturates: NOT DETECTED
Benzodiazepines: POSITIVE — AB
Cocaine: NOT DETECTED
Opiates: NOT DETECTED
Tetrahydrocannabinol: NOT DETECTED

## 2019-05-08 LAB — COMPREHENSIVE METABOLIC PANEL
ALT: 17 U/L (ref 0–44)
AST: 20 U/L (ref 15–41)
Albumin: 3.5 g/dL (ref 3.5–5.0)
Alkaline Phosphatase: 77 U/L (ref 38–126)
Anion gap: 12 (ref 5–15)
BUN: 10 mg/dL (ref 6–20)
CO2: 22 mmol/L (ref 22–32)
Calcium: 8.9 mg/dL (ref 8.9–10.3)
Chloride: 103 mmol/L (ref 98–111)
Creatinine, Ser: 0.81 mg/dL (ref 0.44–1.00)
GFR calc Af Amer: >60 mL/min (ref >60)
GFR calc non Af Amer: >60 mL/min (ref >60)
Glucose, Bld: 262 mg/dL — ABNORMAL HIGH (ref 70–99)
Potassium: 3.8 mmol/L (ref 3.5–5.1)
Sodium: 137 mmol/L (ref 135–145)
Total Bilirubin: 0.5 mg/dL (ref 0.3–1.2)
Total Protein: 6.5 g/dL (ref 6.5–8.1)

## 2019-05-08 LAB — ACETAMINOPHEN LEVEL: Acetaminophen (Tylenol), Serum: <10 ug/mL — ABNORMAL LOW (ref 10–30)

## 2019-05-08 LAB — SALICYLATE LEVEL: Salicylate Lvl: <7 mg/dL (ref 2.8–30.0)

## 2019-05-08 LAB — CBG MONITORING, ED: Glucose-Capillary: 259 mg/dL — ABNORMAL HIGH (ref 70–99)

## 2019-05-08 LAB — ETHANOL: Alcohol, Ethyl (B): <10 mg/dL (ref <10)

## 2019-05-08 LAB — PREGNANCY, URINE: Preg Test, Ur: NEGATIVE

## 2019-05-08 MED ORDER — CLONAZEPAM 0.5 MG PO TABS
0.5000 mg | ORAL_TABLET | Freq: Four times a day (QID) | ORAL | Status: DC
  Administered 2019-05-08 – 2019-05-09 (×2): 0.5 mg via ORAL
  Filled 2019-05-08 (×2): qty 1

## 2019-05-08 MED ORDER — DULOXETINE HCL 30 MG PO CPEP
60.0000 mg | ORAL_CAPSULE | Freq: Every day | ORAL | Status: DC
  Administered 2019-05-09: 60 mg via ORAL
  Filled 2019-05-08: qty 2

## 2019-05-08 MED ORDER — NICOTINE 21 MG/24HR TD PT24
21.0000 mg | MEDICATED_PATCH | Freq: Every day | TRANSDERMAL | Status: DC
  Administered 2019-05-08 – 2019-05-09 (×2): 21 mg via TRANSDERMAL
  Filled 2019-05-08 (×2): qty 1

## 2019-05-08 MED ORDER — LEVOTHYROXINE SODIUM 50 MCG PO TABS
125.0000 ug | ORAL_TABLET | Freq: Every day | ORAL | Status: DC
  Administered 2019-05-09: 125 ug via ORAL
  Filled 2019-05-08: qty 3

## 2019-05-08 MED ORDER — TRAZODONE HCL 50 MG PO TABS: 50.0000 mg | ORAL_TABLET | Freq: Every evening | ORAL | Status: DC | PRN

## 2019-05-08 MED ORDER — GABAPENTIN 400 MG PO CAPS
800.0000 mg | ORAL_CAPSULE | Freq: Four times a day (QID) | ORAL | Status: DC
  Administered 2019-05-08 – 2019-05-09 (×2): 800 mg via ORAL
  Filled 2019-05-08 (×2): qty 2

## 2019-05-08 MED ORDER — INSULIN ASPART PROT & ASPART (70-30 MIX) 100 UNIT/ML ~~LOC~~ SUSP
45.0000 [IU] | Freq: Every day | SUBCUTANEOUS | Status: DC
  Filled 2019-05-08: qty 10

## 2019-05-08 MED ORDER — DIVALPROEX SODIUM 250 MG PO DR TAB
500.0000 mg | DELAYED_RELEASE_TABLET | Freq: Two times a day (BID) | ORAL | Status: DC
  Administered 2019-05-08 – 2019-05-09 (×2): 500 mg via ORAL
  Filled 2019-05-08 (×2): qty 2

## 2019-05-08 MED ORDER — PANTOPRAZOLE SODIUM 40 MG PO TBEC
40.0000 mg | DELAYED_RELEASE_TABLET | Freq: Every day | ORAL | Status: DC
  Administered 2019-05-09: 40 mg via ORAL
  Filled 2019-05-08: qty 1

## 2019-05-08 MED ORDER — INSULIN GLARGINE 100 UNIT/ML ~~LOC~~ SOLN
25.0000 [IU] | Freq: Every day | SUBCUTANEOUS | Status: DC
  Administered 2019-05-08: 25 [IU] via SUBCUTANEOUS
  Filled 2019-05-08 (×4): qty 0.25

## 2019-05-08 NOTE — ED Provider Notes (Signed)
[facility]-EMERGENCY DEPT [facility] Emergency Department Provider Note MRN:  [id number]  Arrival date & time: 05/08/19     Chief Complaint   Suicidal   History of Present Illness   Kristin Humphrey is a 48 y.o. year-old female with a history of depression, diabetes presenting to the ED with chief complaint of suicidal ideation.  Worsening depression at home, explains that her significant other is depressed and she seems to be soaking up his depression.  Also very upset about the recent events, the coronavirus, presidential debate, etc.  Has been having thoughts recently of getting a large knife and stabbing herself in the abdomen.  Explains that she came very close to doing this today but instead came to the emergency department for help.  Denies drugs or alcohol, no AVH, no HI.  Review of Systems  A complete 10 system review of systems was obtained and all systems are negative except as noted in the HPI and PMH.   Patient's Health History    Past Medical History:  Diagnosis Date  . Anxiety   . Chronic pain   . Depression   . Diabetes mellitus without complication [facility])     Past Surgical History:  Procedure Laterality Date  . ABDOMINAL HYSTERECTOMY    . BUNIONECTOMY    . NECK SURGERY      Family History  Problem Relation Age of Onset  . Heart failure Mother   . Diabetes Mother   . Cancer Father   . Cancer Other   . Diabetes Other     Social History   Socioeconomic History  . Marital status: Single    Spouse name: Not on file  . Number of children: Not on file  . Years of education: Not on file  . Highest education level: Not on file  Occupational History  . Not on file  Social Needs  . Financial resource strain: Not on file  . Food insecurity    Worry: Not on file    Inability: Not on file  . Transportation needs    Medical: Not on file    Non-medical: Not on file  Tobacco Use  . Smoking status: Current Every Day Smoker    Packs/day: 0.50    Types:  Cigarettes  . Smokeless tobacco: Never Used  . Tobacco comment: refused  Substance and Sexual Activity  . Alcohol use: No  . Drug use: No  . Sexual activity: Never  Lifestyle  . Physical activity    Days per week: Not on file    Minutes per session: Not on file  . Stress: Not on file  Relationships  . Social Musician on phone: Not on file    Gets together: Not on file    Attends religious service: Not on file    Active member of club or organization: Not on file    Attends meetings of clubs or organizations: Not on file    Relationship status: Not on file  . Intimate partner violence    Fear of current or ex partner: Not on file    Emotionally abused: Not on file    Physically abused: Not on file    Forced sexual activity: Not on file  Other Topics Concern  . Not on file  Social History Narrative  . Not on file     Physical Exam  Vital Signs and Nursing Notes reviewed Vitals:   05/08/19 1737  BP: (!) 158/102  Pulse: (!) 123  Resp: 19  Temp: 99 F (37.2 C)  SpO2: 100%    CONSTITUTIONAL: Well-appearing, NAD, intermittently tearful NEURO:  Alert and oriented x 3, no focal deficits EYES:  eyes equal and reactive ENT/NECK:  no LAD, no JVD CARDIO: Tachycardic rate, well-perfused, normal S1 and S2 PULM:  CTAB no wheezing or rhonchi GI/GU:  normal bowel sounds, non-distended, non-tender MSK/SPINE:  No gross deformities, no edema SKIN:  no rash, atraumatic PSYCH: Depressed speech and behavior  Diagnostic and Interventional Summary    Labs Reviewed  COMPREHENSIVE METABOLIC PANEL - Abnormal; Notable for the following components:      Result Value   Glucose, Bld 262 (*)    All other components within normal limits  ACETAMINOPHEN LEVEL - Abnormal; Notable for the following components:   Acetaminophen (Tylenol), Serum <10 (*)    All other components within normal limits  CBC - Abnormal; Notable for the following components:   WBC 15.0 (*)    RBC 5.24  (*)    RDW 16.0 (*)    All other components within normal limits  RAPID URINE DRUG SCREEN, HOSP PERFORMED - Abnormal; Notable for the following components:   Benzodiazepines POSITIVE (*)    All other components within normal limits  ETHANOL  SALICYLATE LEVEL  PREGNANCY, URINE    No orders to display    Medications - No data to display   Procedures Critical Care  ED Course and Medical Decision Making  I have reviewed the triage vital signs and the nursing notes.  Pertinent labs & imaging results that were available during my care of the patient were reviewed by me and considered in my medical decision making (see below for details).  Major depressive disorder with suicidal ideation, anticipating inpatient admission, will obtain labs, consult TTS.   Work-up reassuring, medically cleared, awaiting TTS recommendations.  Signed out to default provider.  Barth Kirks. Sedonia Small, [place] [email or web address]  Final Clinical Impressions(s) / ED Diagnoses     ICD-10-CM   1. Major depressive disorder, remission status unspecified, unspecified whether recurrent  F32.9   2. Suicidal ideation  R45.851     ED Discharge Orders    None      Discharge Instructions Discussed with and Provided to Patient: Discharge Instructions   None       Maudie Flakes, MD 05/08/19 2111

## 2019-05-08 NOTE — ED Triage Notes (Signed)
Pt having SI thoughts.  Plan to kill herself with " a knife to the liver" states " I need help"

## 2019-05-08 NOTE — ED Notes (Signed)
Pts belongings locked in locker & wanded by security.
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
## 2019-05-08 NOTE — ED Notes (Signed)
Patient is in the hallway -sitter at bedside remains with 1:1 observation.

## 2019-05-09 ENCOUNTER — Inpatient Hospital Stay (HOSPITAL_COMMUNITY): Admission: AD | Admit: 2019-05-09 | Payer: Medicaid - Out of State | Source: Intra-hospital | Admitting: Psychiatry

## 2019-05-09 LAB — SARS CORONAVIRUS 2 BY RT PCR (HOSPITAL ORDER, PERFORMED IN ~~LOC~~ HOSPITAL LAB): SARS Coronavirus 2: NEGATIVE

## 2019-05-09 MED ORDER — INSULIN ASPART 100 UNIT/ML ~~LOC~~ SOLN
10.0000 [IU] | Freq: Three times a day (TID) | SUBCUTANEOUS | Status: DC
  Administered 2019-05-09: 10 [IU] via SUBCUTANEOUS
  Filled 2019-05-09: qty 1

## 2019-05-09 MED ORDER — INSULIN GLARGINE 100 UNIT/ML ~~LOC~~ SOLN
56.0000 [IU] | Freq: Every day | SUBCUTANEOUS | Status: DC
  Filled 2019-05-09 (×3): qty 0.56

## 2019-05-09 NOTE — ED Notes (Signed)
Pt observed ambulating to restroom without any assistive device or assistance from staff.

## 2019-05-09 NOTE — ED Notes (Signed)
This RN spoke with Pt regarding possible transfer to [facility] under condition Pt could ambulate with a walker and not require use of her cane. Pt responded stating a walker would be fine but a wheel chair would put too much pressure on her spine. She understands reason why [facility] would not want her to have cane at their facility and remains still voluntary and cooperative.

## 2019-05-09 NOTE — BHH Counselor (Signed)
TTS reassessment: Patient is alert and oriented x 4. Patient reports that she is feeling better and would like to be discharged home to follow up with her outpatient provider. She denies SI/HI/AVH. Patient gave verbal consent for TTS to contact her boyfriend, Esmond Plants [phone].  TTS left a HIPPA compliant voice mail for patient's boyfriend.   Per Dr. Dwyane Dee patient is PSYCHIATRICALLY [place].

## 2019-05-09 NOTE — Discharge Instructions (Addendum)
You were seen in the emergency department for having suicidal thoughts.  You were evaluated by behavioral health and felt to be stable for discharge with outpatient follow-up.  Please follow-up with your counselor and return to the emergency department if any worsening symptoms.

## 2019-05-09 NOTE — ED Notes (Signed)
Spoke with Lynnda Child at [facility] regarding pt status, requesting that pt have covid test as she may possibly have a bed for pt tonight at [facility]; and if not tonight will get bed in the morning. Hassan Rowan, RN charge nurse and primary nurse made aware.

## 2019-05-09 NOTE — ED Provider Notes (Signed)
Emergency Medicine Observation Re-evaluation Note  Kristin Humphrey is a 48 y.o. female, seen on rounds today.  Pt initially presented to the ED for complaints of Suicidal Currently, the patient is calm.  Physical Exam  BP 109/69 (BP Location: Right Arm)   Pulse 79   Temp 98.5 F (36.9 C) (Oral)   Resp 18   Ht 5\' 4"  (1.626 m)   Wt 96.2 kg   LMP 04/03/2015   SpO2 97%   BMI 36.39 kg/m  Physical Exam  ED Course / MDM  EKG:    I have reviewed the labs performed to date as well as medications administered while in observation.  Recent changes in the last 24 hours include [facility]HH eval. Plan  Current plan is for discharge`. Patient is not under full IVC at this time.   Hayden Rasmussen, MD 05/09/19 1150

## 2024-02-12 ENCOUNTER — Encounter (HOSPITAL_COMMUNITY): Payer: Self-pay

## 2024-02-12 ENCOUNTER — Emergency Department (HOSPITAL_COMMUNITY)
Admission: EM | Admit: 2024-02-12 | Discharge: 2024-02-12 | Disposition: A | Discharge status: Home / Self Care | Attending: Student | Admitting: Student

## 2024-02-12 ENCOUNTER — Other Ambulatory Visit: Payer: Self-pay

## 2024-02-12 DIAGNOSIS — F419 Anxiety disorder, unspecified: Secondary | ICD-10-CM

## 2024-02-12 DIAGNOSIS — R7309 Other abnormal glucose: Secondary | ICD-10-CM | POA: Insufficient documentation

## 2024-02-12 DIAGNOSIS — R45851 Suicidal ideations: Secondary | ICD-10-CM | POA: Insufficient documentation

## 2024-02-12 DIAGNOSIS — F32A Depression, unspecified: Secondary | ICD-10-CM

## 2024-02-12 HISTORY — DX: Essential (primary) hypertension: I10

## 2024-02-12 HISTORY — DX: Hyperlipidemia, unspecified: E78.5

## 2024-02-12 HISTORY — DX: Gastro-esophageal reflux disease without esophagitis: K21.9

## 2024-02-12 HISTORY — DX: Bipolar disorder, unspecified: F31.9

## 2024-02-12 HISTORY — DX: Sciatica, unspecified side: M54.30

## 2024-02-12 HISTORY — DX: Vitamin D deficiency, unspecified: E55.9

## 2024-02-12 HISTORY — DX: Agoraphobia with panic disorder: F40.01

## 2024-02-12 LAB — URINALYSIS, ROUTINE W REFLEX MICROSCOPIC
Bilirubin Urine: NEGATIVE
Glucose, UA: 50 mg/dL — AB
Hgb urine dipstick: NEGATIVE
Ketones, ur: NEGATIVE mg/dL
Leukocytes,Ua: NEGATIVE
Nitrite: NEGATIVE
Protein, ur: NEGATIVE mg/dL
Specific Gravity, Urine: 1.017 (ref 1.005–1.030)
pH: 5 (ref 5.0–8.0)

## 2024-02-12 LAB — COMPREHENSIVE METABOLIC PANEL WITH GFR
ALT: 39 U/L (ref 0–44)
AST: 34 U/L (ref 15–41)
Albumin: 4.3 g/dL (ref 3.5–5.0)
Alkaline Phosphatase: 80 U/L (ref 38–126)
Anion gap: 9 (ref 5–15)
BUN: 10 mg/dL (ref 6–20)
CO2: 23 mmol/L (ref 22–32)
Calcium: 9.9 mg/dL (ref 8.9–10.3)
Chloride: 104 mmol/L (ref 98–111)
Creatinine, Ser: 1.14 mg/dL — ABNORMAL HIGH (ref 0.44–1.00)
GFR, Estimated: 57 mL/min — ABNORMAL LOW (ref >60)
Glucose, Bld: 234 mg/dL — ABNORMAL HIGH (ref 70–99)
Potassium: 3.3 mmol/L — ABNORMAL LOW (ref 3.5–5.1)
Sodium: 136 mmol/L (ref 135–145)
Total Bilirubin: 1 mg/dL (ref 0.0–1.2)
Total Protein: 7.7 g/dL (ref 6.5–8.1)

## 2024-02-12 LAB — RESP PANEL BY RT-PCR (RSV, FLU A&B, COVID)  RVPGX2
Influenza A by PCR: NEGATIVE
Influenza B by PCR: NEGATIVE
Resp Syncytial Virus by PCR: NEGATIVE
SARS Coronavirus 2 by RT PCR: NEGATIVE

## 2024-02-12 LAB — CBC WITH DIFFERENTIAL/PLATELET
Abs Immature Granulocytes: 0.03 K/uL (ref 0.00–0.07)
Basophils Absolute: 0.1 K/uL (ref 0.0–0.1)
Basophils Relative: 1 %
Eosinophils Absolute: 0.2 K/uL (ref 0.0–0.5)
Eosinophils Relative: 2 %
HCT: 43.2 % (ref 36.0–46.0)
Hemoglobin: 15.1 g/dL — ABNORMAL HIGH (ref 12.0–15.0)
Immature Granulocytes: 0 %
Lymphocytes Relative: 36 %
Lymphs Abs: 3.4 K/uL (ref 0.7–4.0)
MCH: 31.2 pg (ref 26.0–34.0)
MCHC: 35 g/dL (ref 30.0–36.0)
MCV: 89.3 fL (ref 80.0–100.0)
Monocytes Absolute: 0.4 K/uL (ref 0.1–1.0)
Monocytes Relative: 4 %
Neutro Abs: 5.4 K/uL (ref 1.7–7.7)
Neutrophils Relative %: 57 %
Platelets: 231 K/uL (ref 150–400)
RBC: 4.84 MIL/uL (ref 3.87–5.11)
RDW: 12.7 % (ref 11.5–15.5)
WBC: 9.4 K/uL (ref 4.0–10.5)
nRBC: 0 % (ref 0.0–0.2)

## 2024-02-12 LAB — RAPID URINE DRUG SCREEN, HOSP PERFORMED
Amphetamines: NOT DETECTED
Barbiturates: NOT DETECTED
Benzodiazepines: POSITIVE — AB
Cocaine: NOT DETECTED
Opiates: NOT DETECTED
Tetrahydrocannabinol: POSITIVE — AB

## 2024-02-12 LAB — ACETAMINOPHEN LEVEL: Acetaminophen (Tylenol), Serum: <10 ug/mL — ABNORMAL LOW (ref 10–30)

## 2024-02-12 LAB — ETHANOL: Alcohol, Ethyl (B): <15 mg/dL (ref <15)

## 2024-02-12 LAB — LITHIUM LEVEL: Lithium Lvl: <0.06 mmol/L — ABNORMAL LOW (ref 0.60–1.20)

## 2024-02-12 LAB — CBG MONITORING, ED: Glucose-Capillary: 153 mg/dL — ABNORMAL HIGH (ref 70–99)

## 2024-02-12 LAB — SALICYLATE LEVEL: Salicylate Lvl: <7 mg/dL — ABNORMAL LOW (ref 7.0–30.0)

## 2024-02-12 MED ORDER — METHOCARBAMOL 500 MG PO TABS
500.0000 mg | ORAL_TABLET | Freq: Three times a day (TID) | ORAL | Status: DC
  Administered 2024-02-12: 500 mg via ORAL
  Filled 2024-02-12: qty 1

## 2024-02-12 MED ORDER — INSULIN ASPART 100 UNIT/ML IJ SOLN: 0.0000 [IU] | Freq: Every day | INTRAMUSCULAR | Status: DC

## 2024-02-12 MED ORDER — POTASSIUM CHLORIDE CRYS ER 20 MEQ PO TBCR
40.0000 meq | EXTENDED_RELEASE_TABLET | Freq: Once | ORAL | Status: AC
  Administered 2024-02-12: 40 meq via ORAL
  Filled 2024-02-12: qty 2

## 2024-02-12 MED ORDER — CLONAZEPAM 0.5 MG PO TABS: 0.5000 mg | ORAL_TABLET | Freq: Two times a day (BID) | ORAL | Status: DC | PRN

## 2024-02-12 MED ORDER — LISINOPRIL 10 MG PO TABS: 20.0000 mg | ORAL_TABLET | Freq: Every day | ORAL | Status: DC

## 2024-02-12 MED ORDER — EZETIMIBE 10 MG PO TABS
10.0000 mg | ORAL_TABLET | Freq: Every day | ORAL | Status: DC
  Administered 2024-02-12: 10 mg via ORAL
  Filled 2024-02-12: qty 1

## 2024-02-12 MED ORDER — LITHIUM CARBONATE ER 300 MG PO TBCR
300.0000 mg | EXTENDED_RELEASE_TABLET | Freq: Every day | ORAL | Status: DC
  Administered 2024-02-12: 300 mg via ORAL
  Filled 2024-02-12: qty 1

## 2024-02-12 MED ORDER — PROPRANOLOL HCL 10 MG PO TABS
10.0000 mg | ORAL_TABLET | Freq: Two times a day (BID) | ORAL | Status: DC
  Administered 2024-02-12: 10 mg via ORAL
  Filled 2024-02-12: qty 1

## 2024-02-12 MED ORDER — BUPROPION HCL ER (XL) 150 MG PO TB24: 300.0000 mg | ORAL_TABLET | Freq: Every day | ORAL | Status: DC

## 2024-02-12 MED ORDER — CLONAZEPAM 0.5 MG PO TABS
0.5000 mg | ORAL_TABLET | Freq: Once | ORAL | Status: AC
  Administered 2024-02-12: 0.5 mg via ORAL
  Filled 2024-02-12: qty 1

## 2024-02-12 MED ORDER — VITAMIN D (ERGOCALCIFEROL) 1.25 MG (50000 UNIT) PO CAPS: 50000.0000 [IU] | ORAL_CAPSULE | ORAL | Status: DC

## 2024-02-12 MED ORDER — PANTOPRAZOLE SODIUM 40 MG PO TBEC: 40.0000 mg | DELAYED_RELEASE_TABLET | Freq: Every day | ORAL | Status: DC

## 2024-02-12 MED ORDER — PREGABALIN 50 MG PO CAPS
100.0000 mg | ORAL_CAPSULE | Freq: Three times a day (TID) | ORAL | Status: DC
  Administered 2024-02-12: 100 mg via ORAL
  Filled 2024-02-12 (×2): qty 2

## 2024-02-12 MED ORDER — INSULIN ASPART 100 UNIT/ML IJ SOLN: 0.0000 [IU] | Freq: Three times a day (TID) | INTRAMUSCULAR | Status: DC

## 2024-02-12 MED ORDER — BUDESON-GLYCOPYRROL-FORMOTEROL 160-9-4.8 MCG/ACT IN AERO
2.0000 | INHALATION_SPRAY | Freq: Two times a day (BID) | RESPIRATORY_TRACT | Status: DC
  Administered 2024-02-12: 2 via RESPIRATORY_TRACT
  Filled 2024-02-12 (×2): qty 5.9

## 2024-02-12 MED ORDER — METFORMIN HCL 500 MG PO TABS: 500.0000 mg | ORAL_TABLET | Freq: Every day | ORAL | Status: DC

## 2024-02-12 MED ORDER — DULOXETINE HCL 30 MG PO CPEP: 60.0000 mg | ORAL_CAPSULE | Freq: Every day | ORAL | Status: DC

## 2024-02-12 MED ORDER — FAMOTIDINE 20 MG PO TABS: 10.0000 mg | ORAL_TABLET | Freq: Two times a day (BID) | ORAL | Status: DC | PRN

## 2024-02-12 MED ORDER — SODIUM CHLORIDE 0.9 % IV BOLUS
1000.0000 mL | Freq: Once | INTRAVENOUS | Status: AC
  Administered 2024-02-12: 1000 mL via INTRAVENOUS

## 2024-02-12 NOTE — ED Notes (Signed)
 RT at bedside.

## 2024-02-12 NOTE — ED Provider Notes (Signed)
 [place] EMERGENCY DEPARTMENT AT [facility] Provider Note   CSN: [id number] Arrival date & time: 02/12/24  1528     Patient presents with: Suicidal   Kristin Humphrey is a 53 y.o. female.   Patient is a 53 year old female who presents to the emergency department with a chief complaint of increased depression, anxiety and suicidal thoughts.  Patient notes that she has no active plan at this time.  Patient notes that she feels as though her home medications are not working at this time.  Patient notes that she has not done anything to harm herself.  She notes that she was sent by psychiatry today secondary to increasing suicidal thoughts.  Patient denies any hallucinations or delusions.  She has had no associated alcohol or illicit substance abuse.  Patient notes that she has been compliant with all home medications.  She denies any systemic complaints at this time to include fever, chills, chest pain, shortness of breath, Donnell pain, nausea, vomiting, diarrhea.        Prior to Admission medications   Medication Sig Start Date End Date Taking? Authorizing Provider  buPROPion  (WELLBUTRIN  XL) 300 MG 24 hr tablet Take 300 mg by mouth daily. 05/22/22  Yes [provider]  clonazePAM  (KLONOPIN ) 1 MG tablet Take 0.5 mg by mouth in the morning, at noon, in the evening, and at bedtime. 02/01/24  Yes [provider]  DULoxetine  (CYMBALTA ) 60 MG capsule Take 1 capsule (60 mg total) by mouth daily. 03/08/16  Yes Secundino Cones, NP  ezetimibe  (ZETIA ) 10 MG tablet Take 10 mg by mouth in the morning and at bedtime. 01/11/24  Yes [provider]  famotidine  (PEPCID ) 10 MG tablet Take 10 mg by mouth 2 (two) times daily as needed for heartburn or indigestion.   Yes [provider]  insulin  NPH-regular Human (NOVOLIN 70/30) (70-30) 100 UNIT/ML injection Inject 45 Units into the skin every morning.   Yes [provider]  LANTUS  SOLOSTAR 100 UNIT/ML Solostar  Pen Inject 56 Units into the skin at bedtime. 07/18/22  Yes [provider]  lisinopril  (ZESTRIL ) 20 MG tablet Take 20 mg by mouth daily. 03/02/20  Yes [provider]  lithium  carbonate (LITHOBID ) 300 MG ER tablet Take 300 mg by mouth daily.   Yes [provider]  metFORMIN  (GLUCOPHAGE ) 500 MG tablet Take 500 mg by mouth daily.   Yes [provider]  methocarbamol  (ROBAXIN ) 500 MG tablet Take 500 mg by mouth 3 (three) times daily.   Yes [provider]  omeprazole (PRILOSEC) 40 MG capsule Take 40 mg by mouth every morning.   Yes [provider]  pregabalin  (LYRICA ) 100 MG capsule Take 100 mg by mouth 3 (three) times daily. 02/01/24  Yes [provider]  propranolol  (INDERAL ) 10 MG tablet Take 10 mg by mouth 2 (two) times daily. 02/01/24  Yes [provider]  DOMINIC BECK 200-62.5-25 MCG/ACT AEPB Inhale 1 puff into the lungs daily. 01/21/24  Yes [provider]  Vitamin D , Ergocalciferol , (DRISDOL ) 1.25 MG (50000 UNIT) CAPS capsule Take 50,000 Units by mouth every Wednesday.   Yes [provider]    Allergies: Acid blockers support, Adhesive [tape], Celebrex [celecoxib], Ibuprofen , Statins, Wound dressing adhesive, and Zolmitriptan    Review of Systems  Psychiatric/Behavioral:  Positive for suicidal ideas.   All other systems reviewed and are negative.   Updated Vital Signs BP (!) 127/97 (BP Location: Right Arm)   Pulse 90   Resp  18   Ht 5' 4 (1.626 m)   Wt 88.5 kg   LMP 04/03/2015   SpO2 99%   BMI 33.47 kg/m   Physical Exam Vitals and nursing note reviewed.  Constitutional:      Appearance: Normal appearance.  HENT:     Head: Normocephalic and atraumatic.     Nose: Nose normal.     Mouth/Throat:     Mouth: Mucous membranes are moist.  Eyes:     Extraocular Movements: Extraocular movements intact.     Conjunctiva/sclera: Conjunctivae normal.     Pupils: Pupils are equal, round, and  reactive to light.  Cardiovascular:     Rate and Rhythm: Normal rate and regular rhythm.     Pulses: Normal pulses.     Heart sounds: Normal heart sounds. No murmur heard.    No gallop.  Pulmonary:     Effort: Pulmonary effort is normal. No respiratory distress.     Breath sounds: Normal breath sounds. No stridor. No wheezing, rhonchi or rales.  Abdominal:     General: Abdomen is flat. Bowel sounds are normal. There is no distension.     Palpations: Abdomen is soft.     Tenderness: There is no abdominal tenderness. There is no guarding.  Musculoskeletal:        General: Normal range of motion.     Cervical back: Normal range of motion and neck supple.  Skin:    General: Skin is warm and dry.  Neurological:     General: No focal deficit present.     Mental Status: She is alert and oriented to person, place, and time. Mental status is at baseline.  Psychiatric:        Attention and Perception: She is attentive. She does not perceive auditory or visual hallucinations.        Mood and Affect: Mood is anxious and depressed. Affect is tearful. Affect is not flat, angry or inappropriate.        Speech: She is communicative. Speech is not rapid and pressured, delayed, slurred or tangential.        Behavior: Behavior is withdrawn. Behavior is not agitated, slowed, aggressive, hyperactive or combative.        Thought Content: Thought content is not paranoid or delusional. Thought content includes suicidal ideation. Thought content does not include homicidal ideation. Thought content does not include homicidal or suicidal plan.        Cognition and Memory: Cognition is not impaired. Memory is not impaired. She does not exhibit impaired recent memory or impaired remote memory.        Judgment: Judgment is not impulsive or inappropriate.     (all labs ordered are listed, but only abnormal results are displayed) Labs Reviewed  COMPREHENSIVE METABOLIC PANEL WITH GFR - Abnormal; Notable for the  following components:      Result Value   Potassium 3.3 (*)    Glucose, Bld 234 (*)    Creatinine, Ser 1.14 (*)    GFR, Estimated 57 (*)    All other components within normal limits  CBC WITH DIFFERENTIAL/PLATELET - Abnormal; Notable for the following components:   Hemoglobin 15.1 (*)    All other components within normal limits  ACETAMINOPHEN  LEVEL - Abnormal; Notable for the following components:   Acetaminophen  (Tylenol ), Serum <10 (*)    All other components within normal limits  SALICYLATE LEVEL - Abnormal; Notable for the following components:   Salicylate Lvl <7.0 (*)    All other  components within normal limits  RESP PANEL BY RT-PCR (RSV, FLU A&B, COVID)  RVPGX2  ETHANOL  RAPID URINE DRUG SCREEN, HOSP PERFORMED  URINALYSIS, ROUTINE W REFLEX MICROSCOPIC  LITHIUM  LEVEL    EKG: None  Radiology: No results found.   Procedures   Medications Ordered in the ED  buPROPion  (WELLBUTRIN  XL) 24 hr tablet 300 mg (has no administration in time range)  clonazePAM  (KLONOPIN ) tablet 0.5 mg (has no administration in time range)  DULoxetine  (CYMBALTA ) DR capsule 60 mg (has no administration in time range)  ezetimibe  (ZETIA ) tablet 10 mg (10 mg Oral Given 02/12/24 1732)  famotidine  (PEPCID ) tablet 10 mg (has no administration in time range)  lisinopril  (ZESTRIL ) tablet 20 mg (has no administration in time range)  lithium  carbonate (LITHOBID ) ER tablet 300 mg (has no administration in time range)  metFORMIN  (GLUCOPHAGE ) tablet 500 mg (has no administration in time range)  methocarbamol  (ROBAXIN ) tablet 500 mg (has no administration in time range)  pantoprazole  (PROTONIX ) EC tablet 40 mg (has no administration in time range)  pregabalin  (LYRICA ) capsule 100 mg (100 mg Oral Given 02/12/24 1732)  propranolol  (INDERAL ) tablet 10 mg (has no administration in time range)  budesonide -glycopyrrolate -formoterol  (BREZTRI ) 160-9-4.8 MCG/ACT inhaler 2 puff (has no administration in time range)   Vitamin D  (Ergocalciferol ) (DRISDOL ) 1.25 MG (50000 UNIT) capsule 50,000 Units (has no administration in time range)  insulin  aspart (novoLOG ) injection 0-15 Units (has no administration in time range)  insulin  aspart (novoLOG ) injection 0-5 Units (has no administration in time range)  potassium chloride  SA (KLOR-CON  M) CR tablet 40 mEq (has no administration in time range)  clonazePAM  (KLONOPIN ) tablet 0.5 mg (0.5 mg Oral Given 02/12/24 1710)                                    Medical Decision Making Patient does remain stable at this time.  Blood work has been overall unremarkable at this point it is a very mild elevation in creatinine and hyperglycemia.  She has been given IV fluids and has been placed on a sliding scale insulin .  Did reorder the patient's home medications.  Did order TTS evaluation at this time for further evaluation given the patient's suicidal thoughts.  Lithium  level is still pending at this point.  Will sign patient out to Dr. Albertina pending lithium  level result and final medical clearance.  Amount and/or Complexity of Data Reviewed Labs: ordered.  Risk Prescription drug management.        Final diagnoses:  None    ED Discharge Orders     None          Daralene Lonni JONETTA DEVONNA 02/12/24 1859    Albertina Dixon, MD 02/12/24 [phone]

## 2024-02-12 NOTE — Consult Note (Signed)
 Kristin Humphrey  Patient Name: Kristin Humphrey MRN: [id number] DOB: March 11, 1971 DATE OF Consult: 02/12/2024  PRIMARY PSYCHIATRIC DIAGNOSES  1.  Unspecified anxiety disorder 2.  Depression, unspecified   RECOMMENDATIONS  Recommendations: Medication recommendations: Continue home medications Non-Medication/therapeutic recommendations: Follow-up with outpatient therapist and psychiatrist; crisis line information; ED return precautions  Is inpatient psychiatric hospitalization recommended for this patient? No (Explain why): Denies active suicidal ideation, intent, plan. Has an appointment with her psychiatrist tomorrow  Follow-Up Telepsychiatry C/L services: We will sign off for now. Please re-consult our service if needed for any concerning changes in the patient's condition, discharge planning, or questions. Communication: Treatment team members (and family members if applicable) who were involved in treatment/care discussions and planning, and with whom we spoke or engaged with via secure text/chat, include the following: Team via Epic chat   Thank you for involving us  in the care of this patient. If you have any additional questions or concerns, please call [phone] and ask for me or the provider on-call.  TELEPSYCHIATRY ATTESTATION & CONSENT  As the provider for this telehealth consult, I attest that I verified the patient's identity using two separate identifiers, introduced myself to the patient, provided my credentials, disclosed my location, and performed this encounter via a HIPAA-compliant, real-time, face-to-face, two-way, interactive audio and video platform and with the full consent and agreement of the patient (or guardian as applicable.)  Patient physical location: ED in [facility] Telehealth provider physical location: home office in state of [place]    Video start time: 848 PM EST Video end time: 917 PM EST   IDENTIFYING DATA  Kristin Humphrey is a 53  y.o. year-old female for whom a psychiatric consultation has been ordered by the primary provider. The patient was identified using two separate identifiers.  CHIEF COMPLAINT/REASON FOR CONSULT  Suicidal ideation    HISTORY OF PRESENT ILLNESS (HPI)  Kristin Humphrey is a 53 year old female with a history of depression, bipolar disorder, anxiety, panic disorder, chronic pain, hyperlipidemia, diabetes, hypertension, GERD who presents to the ED after being sent by her outpatient psychiatrist for suicidal ideation. Chart reviewed. UDS positive for benzos and cannabis. Psychiatry consulted for evaluation and management.  On evaluation, patient noted to be anxious, perseverative about Klonopin , not appearing internally preoccupied, not responding to internal stimuli, alert and oriented x 3. Patient reports she has been taking Klonopin  for 6 years. She states she takes Klonopin  0.5mg  QID. She states the pharmacist started to give her a new generic brand several months ago. And states the new brand makes her feel loopy and sick. I can't calm down. Patient reports she has been feeling this way for 2 months. She reports she saw videos on TikTok of Doctor Garrel who said other people have had a similar experience. Patient reports she has Fits on the ground just flopping due to panic. She states she has agoraphobia and doesn't leave her house for 3-6 months at a time. She reports she has been feeling more anxious and restless. Patient endorses passive suicidal ideation, denies active suicidal ideation, intent, plan. Denies a history of suicide attempts or non-suicidal self injury. Denies that she has ever done anything to prepare to end her life. Reports 10 years ago she came to the hospital due to suicidal ideation with a plan but denies intent or preparatory behaviors and denies she feels this way currently. Denies access to firearms. Patient reports she has had two back surgeries and is in chronic pain.  Patient reports  due to her chronic pain I'm always down and depressed. Patient endorses longstanding problems with insomnia. Denies hopelessness, other depressive symptoms. Denies symptoms consistent with mania/hypomania, auditory and visual hallucinations, homicidal ideation. She states of her psychiatrist, I just need her to figure out the medicine. People think it's all in my head, but it's not. Patient reports she lives with her boyfriend. She states her boyfriend complains about everything.  Patient states, I don't want you to think I'm drug-seeking, though no mention of this or reference to this was made. Patient reports she has a psychiatry appointment tomorrow at 2:00 PM and wants to see her own doctor about her medication. Patient reports she wants to go home. Reports she has friends she talks to and can call. Also reports she knows when she starts to have worsening suicidal thoughts to call 911 or come to the ED. States she has done so in the past.    PAST PSYCHIATRIC HISTORY  Prior psych med trials: Depakote , Lithium , Cymbalta , Ativan , Klonopin , Valium, Lunesta, propranolol , Wellbutrin    Psychiatric hospitalizations: 2-3 prior psychiatric hospitalizations   Outpatient mental health treatment: Has a psychiatrist and therapist   Prior suicide attempts: Denies   Otherwise as per HPI above.  PAST MEDICAL HISTORY  Past Medical History:  Diagnosis Date   Anxiety    Bipolar disorder, unspecified (HCC)    Chronic pain    Depression    Diabetes mellitus without complication (HCC)    GERD (gastroesophageal reflux disease)    Hyperlipidemia    Hypertension    Panic disorder with agoraphobia    Sciatica    Vitamin D  deficiency      HOME MEDICATIONS  Facility Ordered Medications  Medication   [COMPLETED] clonazePAM  (KLONOPIN ) tablet 0.5 mg   [START ON 02/13/2024] buPROPion  (WELLBUTRIN  XL) 24 hr tablet 300 mg   clonazePAM  (KLONOPIN ) tablet 0.5 mg   [START ON 02/13/2024] DULoxetine  (CYMBALTA ) DR  capsule 60 mg   ezetimibe  (ZETIA ) tablet 10 mg   famotidine  (PEPCID ) tablet 10 mg   [START ON 02/13/2024] lisinopril  (ZESTRIL ) tablet 20 mg   lithium  carbonate (LITHOBID ) ER tablet 300 mg   [START ON 02/13/2024] metFORMIN  (GLUCOPHAGE ) tablet 500 mg   methocarbamol  (ROBAXIN ) tablet 500 mg   [START ON 02/13/2024] pantoprazole  (PROTONIX ) EC tablet 40 mg   pregabalin  (LYRICA ) capsule 100 mg   propranolol  (INDERAL ) tablet 10 mg   budesonide -glycopyrrolate -formoterol  (BREZTRI ) 160-9-4.8 MCG/ACT inhaler 2 puff   [START ON 02/13/2024] Vitamin D  (Ergocalciferol ) (DRISDOL ) 1.25 MG (50000 UNIT) capsule 50,000 Units   [START ON 02/13/2024] insulin  aspart (novoLOG ) injection 0-15 Units   insulin  aspart (novoLOG ) injection 0-5 Units   [COMPLETED] potassium chloride  SA (KLOR-CON  M) CR tablet 40 mEq   [COMPLETED] sodium chloride  0.9 % bolus 1,000 mL   PTA Medications  Medication Sig   DULoxetine  (CYMBALTA ) 60 MG capsule Take 1 capsule (60 mg total) by mouth daily.   insulin  NPH-regular Human (NOVOLIN 70/30) (70-30) 100 UNIT/ML injection Inject 45 Units into the skin every morning.   methocarbamol  (ROBAXIN ) 500 MG tablet Take 500 mg by mouth 3 (three) times daily.   omeprazole (PRILOSEC) 40 MG capsule Take 40 mg by mouth every morning.   buPROPion  (WELLBUTRIN  XL) 300 MG 24 hr tablet Take 300 mg by mouth daily.   ezetimibe  (ZETIA ) 10 MG tablet Take 10 mg by mouth in the morning and at bedtime.   famotidine  (PEPCID ) 10 MG tablet Take 10 mg by mouth 2 (two) times daily as  needed for heartburn or indigestion.   TRELEGY ELLIPTA 200-62.5-25 MCG/ACT AEPB Inhale 1 puff into the lungs daily.   lisinopril  (ZESTRIL ) 20 MG tablet Take 20 mg by mouth daily.   lithium  carbonate (LITHOBID ) 300 MG ER tablet Take 300 mg by mouth daily.   metFORMIN  (GLUCOPHAGE ) 500 MG tablet Take 500 mg by mouth daily.   pregabalin  (LYRICA ) 100 MG capsule Take 100 mg by mouth 3 (three) times daily.   propranolol  (INDERAL ) 10 MG tablet Take 10 mg  by mouth 2 (two) times daily.   clonazePAM  (KLONOPIN ) 1 MG tablet Take 0.5 mg by mouth in the morning, at noon, in the evening, and at bedtime.   LANTUS  SOLOSTAR 100 UNIT/ML Solostar Pen Inject 56 Units into the skin at bedtime.     ALLERGIES  Allergies  Allergen Reactions   Acid Blockers Support Other (See Comments)    Seizures   Adhesive [Tape] Other (See Comments)    Causes blisters and redness. Prefers paper tape    Celebrex [Celecoxib] Other (See Comments)    Pt reports is makes her hurt worse   Ibuprofen  Other (See Comments)    Has ulcer   Statins Other (See Comments)    Seizures    Wound Dressing Adhesive Other (See Comments)    Causes blisters and redness. Prefers paper tape   Zolmitriptan Other (See Comments)    Chest tightness, Chest pain    SOCIAL & SUBSTANCE USE HISTORY  Social History   Socioeconomic History   Marital status: Single    Spouse name: Not on file   Number of children: Not on file   Years of education: Not on file   Highest education level: Not on file  Occupational History   Not on file  Tobacco Use   Smoking status: Every Day    Current packs/day: 0.50    Types: Cigarettes   Smokeless tobacco: Never   Tobacco comments:    refused  Vaping Use   Vaping status: Never Used  Substance and Sexual Activity   Alcohol use: No   Drug use: No   Sexual activity: Never  Other Topics Concern   Not on file  Social History Narrative   Not on file   Social Drivers of Health   Financial Resource Strain: Medium Risk (07/21/2022)   Received from [facility]   Overall Financial Resource Strain (CARDIA)    Difficulty of Paying Living Expenses: Somewhat hard  Food Insecurity: Food Insecurity Present (07/21/2022)   Received from [facility]   Hunger Vital Sign    Within the past 12 months, you worried that your food would run out before you got the money to buy more.: Sometimes true    Within the past 12 months,  the food you bought just didn't last and you didn't have money to get more.: Sometimes true  Transportation Needs: Unmet Transportation Needs (07/21/2022)   Received from [facility] - Transportation    In the past 12 months, has lack of transportation kept you from medical appointments or from getting medications?: Yes    Lack of Transportation (Non-Medical): No  Physical Activity: Not on file  Stress: Not on file  Social Connections: Not on file   Social History   Tobacco Use  Smoking Status Every Day   Current packs/day: 0.50   Types: Cigarettes  Smokeless Tobacco Never  Tobacco Comments   refused   Social History   Substance and  Sexual Activity  Alcohol Use No   Social History   Substance and Sexual Activity  Drug Use No    Denies    FAMILY HISTORY  Family History  Problem Relation Age of Onset   Heart failure Mother    Diabetes Mother    Cancer Father    Cancer Other    Diabetes Other    Family Psychiatric History (if known):  Father: attempted suicide   MENTAL STATUS EXAM (MSE)  Mental Status Exam: General Appearance: Casual  Orientation:  Full (Time, Place, and Person)  Memory:  Recent;   Good  Concentration:  Concentration: Fair  Recall:  Fair  Attention  Fair  Eye Contact:  Good  Speech:  Clear and Coherent  Language:  Good  Volume:  Normal  Mood: Anxious  Affect:  Congruent  Thought Process:  Coherent  Thought Content:  Abstract Reasoning  Suicidal Thoughts:  No  Homicidal Thoughts:  No  Judgement:  Fair  Insight:  Fair  Psychomotor Activity:  Normal  Akathisia:  Negative  Fund of Knowledge:  Fair    Assets:  Manufacturing systems engineer Desire for Improvement Social Support  Cognition:  WNL  ADL's:  Intact  AIMS (if indicated):       VITALS  Blood pressure (!) 127/97, pulse 90, resp. rate 18, height 5' 4 (1.626 m), weight 88.5 kg, last menstrual period 04/03/2015, SpO2 99%.  LABS  Admission on 02/12/2024   Component Date Value Ref Range Status   Sodium 02/12/2024 136  135 - 145 mmol/L Final   Potassium 02/12/2024 3.3 (L)  3.5 - 5.1 mmol/L Final   Chloride 02/12/2024 104  98 - 111 mmol/L Final   CO2 02/12/2024 23  22 - 32 mmol/L Final   Glucose, Bld 02/12/2024 234 (H)  70 - 99 mg/dL Final   Glucose reference range applies only to samples taken after fasting for at least 8 hours.   BUN 02/12/2024 10  6 - 20 mg/dL Final   Creatinine, Ser 02/12/2024 1.14 (H)  0.44 - 1.00 mg/dL Final   Calcium 92/91/7974 9.9  8.9 - 10.3 mg/dL Final   Total Protein 92/91/7974 7.7  6.5 - 8.1 g/dL Final   Albumin 92/91/7974 4.3  3.5 - 5.0 g/dL Final   AST 92/91/7974 34  15 - 41 U/L Final   ALT 02/12/2024 39  0 - 44 U/L Final   Alkaline Phosphatase 02/12/2024 80  38 - 126 U/L Final   Total Bilirubin 02/12/2024 1.0  0.0 - 1.2 mg/dL Final   GFR, Estimated 02/12/2024 57 (L)  >60 mL/min Final   Comment: (Humphrey) Calculated using the CKD-EPI Creatinine Equation (2021)    Anion gap 02/12/2024 9  5 - 15 Final   Performed at [facility], [address], [place], [place] [address]   Alcohol, Ethyl (B) 02/12/2024 <15  <15 mg/dL Final   Comment: (Humphrey) For medical purposes only. Performed at [facility], [address] [place], [place] [address]    Opiates 02/12/2024 NONE DETECTED  NONE DETECTED Final   Cocaine 02/12/2024 NONE DETECTED  NONE DETECTED Final   Benzodiazepines 02/12/2024 POSITIVE (A)  NONE DETECTED Final   Amphetamines 02/12/2024 NONE DETECTED  NONE DETECTED Final   Tetrahydrocannabinol 02/12/2024 POSITIVE (A)  NONE DETECTED Final   Barbiturates 02/12/2024 NONE DETECTED  NONE DETECTED Final   Comment: (Humphrey) DRUG SCREEN FOR MEDICAL PURPOSES ONLY.  IF CONFIRMATION IS NEEDED FOR ANY PURPOSE, NOTIFY LAB WITHIN 5 DAYS.  LOWEST DETECTABLE LIMITS FOR  URINE DRUG SCREEN Drug Class                     Cutoff (ng/mL) Amphetamine and metabolites    1000 Barbiturate and metabolites    200 Benzodiazepine                  200 Opiates and metabolites        300 Cocaine and metabolites        300 THC                            50 Performed at [facility], [address] [place], [place] [address]    WBC 02/12/2024 9.4  4.0 - 10.5 K/uL Final   RBC 02/12/2024 4.84  3.87 - 5.11 MIL/uL Final   Hemoglobin 02/12/2024 15.1 (H)  12.0 - 15.0 g/dL Final   HCT 92/91/7974 43.2  36.0 - 46.0 % Final   MCV 02/12/2024 89.3  80.0 - 100.0 fL Final   MCH 02/12/2024 31.2  26.0 - 34.0 pg Final   MCHC 02/12/2024 35.0  30.0 - 36.0 g/dL Final   RDW 92/91/7974 12.7  11.5 - 15.5 % Final   Platelets 02/12/2024 231  150 - 400 K/uL Final   nRBC 02/12/2024 0.0  0.0 - 0.2 % Final   Neutrophils Relative % 02/12/2024 57  % Final   Neutro Abs 02/12/2024 5.4  1.7 - 7.7 K/uL Final   Lymphocytes Relative 02/12/2024 36  % Final   Lymphs Abs 02/12/2024 3.4  0.7 - 4.0 K/uL Final   Monocytes Relative 02/12/2024 4  % Final   Monocytes Absolute 02/12/2024 0.4  0.1 - 1.0 K/uL Final   Eosinophils Relative 02/12/2024 2  % Final   Eosinophils Absolute 02/12/2024 0.2  0.0 - 0.5 K/uL Final   Basophils Relative 02/12/2024 1  % Final   Basophils Absolute 02/12/2024 0.1  0.0 - 0.1 K/uL Final   Immature Granulocytes 02/12/2024 0  % Final   Abs Immature Granulocytes 02/12/2024 0.03  0.00 - 0.07 K/uL Final   Performed at [facility], [address] [place], [place] [address]   Color, Urine 02/12/2024 YELLOW  YELLOW Final   APPearance 02/12/2024 CLEAR  CLEAR Final   Specific Gravity, Urine 02/12/2024 1.017  1.005 - 1.030 Final   pH 02/12/2024 5.0  5.0 - 8.0 Final   Glucose, UA 02/12/2024 50 (A)  NEGATIVE mg/dL Final   Hgb urine dipstick 02/12/2024 NEGATIVE  NEGATIVE Final   Bilirubin Urine 02/12/2024 NEGATIVE  NEGATIVE Final   Ketones, ur 02/12/2024 NEGATIVE  NEGATIVE mg/dL Final   Protein, ur 92/91/7974 NEGATIVE  NEGATIVE mg/dL Final   Nitrite 92/91/7974 NEGATIVE  NEGATIVE Final   Leukocytes,Ua 02/12/2024 NEGATIVE  NEGATIVE Final    Performed at [facility], [address] [place], [place] [address]   Acetaminophen  (Tylenol ), Serum 02/12/2024 <10 (L)  10 - 30 ug/mL Final   Comment: (Humphrey) Therapeutic concentrations vary significantly. A range of 10-30 ug/mL  may be an effective concentration for many patients. However, some  are best treated at concentrations outside of this range. Acetaminophen  concentrations >150 ug/mL at 4 hours after ingestion  and >50 ug/mL at 12 hours after ingestion are often associated with  toxic reactions.  Performed at [facility], [address], [place], [place] [address]    Salicylate Lvl 02/12/2024 <7.0 (L)  7.0 - 30.0 mg/dL Final   Performed at [facility],  [address] [place], [place] [address]   SARS Coronavirus 2 by RT PCR 02/12/2024 NEGATIVE  NEGATIVE Final   Comment: (Humphrey) SARS-CoV-2 target nucleic acids are NOT DETECTED.  The SARS-CoV-2 RNA is generally detectable in upper respiratory specimens during the acute phase of infection. The lowest concentration of SARS-CoV-2 viral copies this assay can detect is 138 copies/mL. A negative result does not preclude SARS-Cov-2 infection and should not be used as the sole basis for treatment or other patient management decisions. A negative result may occur with  improper specimen collection/handling, submission of specimen other than nasopharyngeal swab, presence of viral mutation(s) within the areas targeted by this assay, and inadequate number of viral copies(<138 copies/mL). A negative result must be combined with clinical observations, patient history, and epidemiological information. The expected result is Negative.  Fact Sheet for Patients:  BloggerCourse.com  Fact Sheet for Healthcare Providers:  SeriousBroker.it  This test is no                          t yet approved or cleared by the [place]  FDA and  has been authorized for detection and/or diagnosis of  SARS-CoV-2 by FDA under an Emergency Use Authorization (EUA). This EUA will remain  in effect (meaning this test can be used) for the duration of the COVID-19 declaration under Section 564(b)(1) of the Act, 21 U.S.C.section 360bbb-3(b)(1), unless the authorization is terminated  or revoked sooner.       Influenza A by PCR 02/12/2024 NEGATIVE  NEGATIVE Final   Influenza B by PCR 02/12/2024 NEGATIVE  NEGATIVE Final   Comment: (Humphrey) The Xpert Xpress SARS-CoV-2/FLU/RSV plus assay is intended as an aid in the diagnosis of influenza from Nasopharyngeal swab specimens and should not be used as a sole basis for treatment. Nasal washings and aspirates are unacceptable for Xpert Xpress SARS-CoV-2/FLU/RSV testing.  Fact Sheet for Patients: BloggerCourse.com  Fact Sheet for Healthcare Providers: SeriousBroker.it  This test is not yet approved or cleared by the [place]  FDA and has been authorized for detection and/or diagnosis of SARS-CoV-2 by FDA under an Emergency Use Authorization (EUA). This EUA will remain in effect (meaning this test can be used) for the duration of the COVID-19 declaration under Section 564(b)(1) of the Act, 21 U.S.C. section 360bbb-3(b)(1), unless the authorization is terminated or revoked.     Resp Syncytial Virus by PCR 02/12/2024 NEGATIVE  NEGATIVE Final   Comment: (Humphrey) Fact Sheet for Patients: BloggerCourse.com  Fact Sheet for Healthcare Providers: SeriousBroker.it  This test is not yet approved or cleared by the [place]  FDA and has been authorized for detection and/or diagnosis of SARS-CoV-2 by FDA under an Emergency Use Authorization (EUA). This EUA will remain in effect (meaning this test can be used) for the duration of the COVID-19 declaration under Section 564(b)(1) of the Act, 21 U.S.C. section 360bbb-3(b)(1), unless the authorization is  terminated or revoked.  Performed at [facility], [address], [place], [place] [address]     PSYCHIATRIC REVIEW OF SYSTEMS (ROS)  ROS: Notable for the following relevant positive findings: Review of Systems  Psychiatric/Behavioral:  Positive for depression. The patient is nervous/anxious and has insomnia.     Additional findings:      Musculoskeletal: No abnormal movements observed      Gait & Station: Laying/Sitting      Pain Screening: Denies      Nutrition & Dental Concerns: n/a  RISK FORMULATION/ASSESSMENT  Is the patient  experiencing any suicidal or homicidal ideations: No     Protective factors considered for safety management: Social support, future oriented, identifies reasons to live   Risk factors/concerns considered for safety management:  Depression Physical illness/chronic pain  Is there a safety management plan with the patient and treatment team to minimize risk factors and promote protective factors: Yes           Explain: -Follow-up with outpatient therapist and psychiatrist; crisis line information; ED return precautions  Is crisis care placement or psychiatric hospitalization recommended: No     Based on my current evaluation and risk assessment, patient is determined at this time to be at:  Moderate Risk - patient at chronically elevated risk due to depression, anxiety, chronic pain, age. Also has significant protective factors. Denies current suicidal ideation, intent, plan.   *RISK ASSESSMENT Risk assessment is a dynamic process; it is possible that this patient's condition, and risk level, may change. This should be re-evaluated and managed over time as appropriate. Please re-consult psychiatric consult services if additional assistance is needed in terms of risk assessment and management. If your team decides to discharge this patient, please advise the patient how to best access emergency psychiatric services, or to call 911, if their condition worsens  or they feel unsafe in any way.   Erla JAYSON Rase, MD Telepsychiatry Consult Services

## 2024-02-12 NOTE — ED Notes (Signed)
 [employer] transportation called by Diplomatic Services operational officer

## 2024-02-12 NOTE — BH Assessment (Signed)
 TTS Consult has been deferred to [facility] for this patient. [facility] Coordinator will respond in chat regarding a time patient can been seen. [facility] can be reached at [phone].

## 2024-02-12 NOTE — ED Notes (Signed)
 [facility] [employer] [facility] called to transport pt to home address. Renaldo Gornick

## 2024-02-12 NOTE — ED Notes (Signed)
 Per conversation with EDP, it was decided to hold-off on changing the Pt into [place] [employer] at this time. Pt calm and cooperative at this time. Visitor at bedside.

## 2024-02-12 NOTE — ED Triage Notes (Signed)
 Pt sent by Psychiatrist for evaluation of SI, pt currently taking medications for anxiety, that she feels aren't helping.  Currently having SI thoughts w/o any plan.

## 2024-02-12 NOTE — ED Provider Notes (Signed)
 Patient is medically clear for TTS evaluation at this time.   Daralene Lonni BIRCH, PA-C 02/12/24 1750    Albertina Dixon, MD 02/12/24 2148

## 2024-02-12 NOTE — ED Notes (Signed)
 AC called regarding pt's lithium . AC to inquire about medication and bring to ED from main pharmacy
